# Patient Record
Sex: Female | Born: 1935 | Race: White | Hispanic: No | Smoking: Never smoker
Health system: Southern US, Community
[De-identification: ages and names within clinical notes are randomized; demographics above are authoritative.]

## PROBLEM LIST (undated history)

## (undated) DIAGNOSIS — F0390 Unspecified dementia without behavioral disturbance: Secondary | ICD-10-CM

## (undated) DIAGNOSIS — I639 Cerebral infarction, unspecified: Secondary | ICD-10-CM

## (undated) DIAGNOSIS — E7800 Pure hypercholesterolemia, unspecified: Secondary | ICD-10-CM

## (undated) DIAGNOSIS — I10 Essential (primary) hypertension: Secondary | ICD-10-CM

## (undated) DIAGNOSIS — I2510 Atherosclerotic heart disease of native coronary artery without angina pectoris: Secondary | ICD-10-CM

## (undated) HISTORY — PX: ABDOMINAL HYSTERECTOMY: SHX81

## (undated) HISTORY — PX: OTHER SURGICAL HISTORY: SHX169

## (undated) HISTORY — PX: CARDIAC SURGERY: SHX584

---

## 2004-09-29 ENCOUNTER — Ambulatory Visit: Payer: Self-pay | Admitting: Family Medicine

## 2008-06-18 ENCOUNTER — Emergency Department: Payer: Self-pay | Admitting: Emergency Medicine

## 2008-06-18 ENCOUNTER — Ambulatory Visit: Payer: Self-pay | Admitting: Family Medicine

## 2010-01-05 ENCOUNTER — Ambulatory Visit: Payer: Self-pay | Admitting: Family Medicine

## 2010-03-28 ENCOUNTER — Ambulatory Visit: Payer: Self-pay | Admitting: Family Medicine

## 2012-01-19 ENCOUNTER — Ambulatory Visit: Payer: Self-pay | Admitting: Family Medicine

## 2013-01-25 ENCOUNTER — Emergency Department: Payer: Self-pay | Admitting: Emergency Medicine

## 2013-01-25 LAB — URINALYSIS, COMPLETE
Bacteria: NONE SEEN
Bilirubin,UR: NEGATIVE
Blood: NEGATIVE
Glucose,UR: NEGATIVE mg/dL (ref 0–75)
Ketone: NEGATIVE
Leukocyte Esterase: NEGATIVE
Nitrite: NEGATIVE
Ph: 5 (ref 4.5–8.0)
Protein: NEGATIVE
RBC,UR: <1 /HPF (ref 0–5)
Specific Gravity: 1.009 (ref 1.003–1.030)
Squamous Epithelial: NONE SEEN
WBC UR: 1 /HPF (ref 0–5)

## 2015-08-25 ENCOUNTER — Other Ambulatory Visit: Payer: Self-pay | Admitting: Physical Medicine and Rehabilitation

## 2015-08-25 DIAGNOSIS — M5417 Radiculopathy, lumbosacral region: Secondary | ICD-10-CM

## 2015-09-14 ENCOUNTER — Ambulatory Visit
Admission: RE | Admit: 2015-09-14 | Discharge: 2015-09-14 | Disposition: A | Discharge status: Home / Self Care | Payer: Medicare Other | Source: Ambulatory Visit | Attending: Physical Medicine and Rehabilitation | Admitting: Physical Medicine and Rehabilitation

## 2015-09-14 DIAGNOSIS — M4806 Spinal stenosis, lumbar region: Secondary | ICD-10-CM | POA: Diagnosis not present

## 2015-09-14 DIAGNOSIS — M47816 Spondylosis without myelopathy or radiculopathy, lumbar region: Secondary | ICD-10-CM | POA: Insufficient documentation

## 2015-09-14 DIAGNOSIS — M4807 Spinal stenosis, lumbosacral region: Secondary | ICD-10-CM | POA: Insufficient documentation

## 2015-09-14 DIAGNOSIS — M5126 Other intervertebral disc displacement, lumbar region: Secondary | ICD-10-CM | POA: Insufficient documentation

## 2015-09-14 DIAGNOSIS — M5416 Radiculopathy, lumbar region: Secondary | ICD-10-CM | POA: Insufficient documentation

## 2015-09-14 DIAGNOSIS — M5417 Radiculopathy, lumbosacral region: Secondary | ICD-10-CM

## 2015-11-13 ENCOUNTER — Other Ambulatory Visit: Payer: Self-pay | Admitting: Pediatrics

## 2015-11-13 ENCOUNTER — Ambulatory Visit
Admission: RE | Admit: 2015-11-13 | Discharge: 2015-11-13 | Disposition: A | Discharge status: Home / Self Care | Payer: Medicare Other | Source: Ambulatory Visit | Attending: Pediatrics | Admitting: Pediatrics

## 2015-11-13 DIAGNOSIS — M25522 Pain in left elbow: Secondary | ICD-10-CM | POA: Diagnosis not present

## 2015-11-13 DIAGNOSIS — M11222 Other chondrocalcinosis, left elbow: Secondary | ICD-10-CM | POA: Diagnosis not present

## 2016-04-07 ENCOUNTER — Emergency Department
Admission: EM | Admit: 2016-04-07 | Discharge: 2016-04-07 | Disposition: A | Discharge status: Home / Self Care | Payer: Medicare Other | Attending: Emergency Medicine | Admitting: Emergency Medicine

## 2016-04-07 ENCOUNTER — Encounter: Payer: Self-pay | Admitting: Medical Oncology

## 2016-04-07 DIAGNOSIS — Y939 Activity, unspecified: Secondary | ICD-10-CM | POA: Insufficient documentation

## 2016-04-07 DIAGNOSIS — S61230A Puncture wound without foreign body of right index finger without damage to nail, initial encounter: Secondary | ICD-10-CM | POA: Diagnosis not present

## 2016-04-07 DIAGNOSIS — W540XXA Bitten by dog, initial encounter: Secondary | ICD-10-CM | POA: Insufficient documentation

## 2016-04-07 DIAGNOSIS — S61200A Unspecified open wound of right index finger without damage to nail, initial encounter: Secondary | ICD-10-CM | POA: Diagnosis present

## 2016-04-07 DIAGNOSIS — S61258A Open bite of other finger without damage to nail, initial encounter: Secondary | ICD-10-CM

## 2016-04-07 DIAGNOSIS — I10 Essential (primary) hypertension: Secondary | ICD-10-CM | POA: Diagnosis not present

## 2016-04-07 DIAGNOSIS — Y929 Unspecified place or not applicable: Secondary | ICD-10-CM | POA: Diagnosis not present

## 2016-04-07 DIAGNOSIS — Y999 Unspecified external cause status: Secondary | ICD-10-CM | POA: Insufficient documentation

## 2016-04-07 DIAGNOSIS — Z91040 Latex allergy status: Secondary | ICD-10-CM | POA: Insufficient documentation

## 2016-04-07 HISTORY — DX: Essential (primary) hypertension: I10

## 2016-04-07 HISTORY — DX: Pure hypercholesterolemia, unspecified: E78.00

## 2016-04-07 LAB — BASIC METABOLIC PANEL
Anion gap: 6 (ref 5–15)
BUN: 19 mg/dL (ref 6–20)
CALCIUM: 9.6 mg/dL (ref 8.9–10.3)
CO2: 28 mmol/L (ref 22–32)
CREATININE: 0.79 mg/dL (ref 0.44–1.00)
Chloride: 104 mmol/L (ref 101–111)
GFR calc non Af Amer: >60 mL/min (ref >60)
Glucose, Bld: 94 mg/dL (ref 65–99)
Potassium: 4.2 mmol/L (ref 3.5–5.1)
SODIUM: 138 mmol/L (ref 135–145)

## 2016-04-07 LAB — CBC WITH DIFFERENTIAL/PLATELET
BASOS ABS: 0.1 10*3/uL (ref 0–0.1)
BASOS PCT: 1 %
EOS ABS: 0.4 10*3/uL (ref 0–0.7)
Eosinophils Relative: 4 %
HEMATOCRIT: 40.5 % (ref 35.0–47.0)
HEMOGLOBIN: 13.9 g/dL (ref 12.0–16.0)
Lymphocytes Relative: 38 %
Lymphs Abs: 3.4 10*3/uL (ref 1.0–3.6)
MCH: 33.9 pg (ref 26.0–34.0)
MCHC: 34.3 g/dL (ref 32.0–36.0)
MCV: 98.9 fL (ref 80.0–100.0)
MONO ABS: 0.9 10*3/uL (ref 0.2–0.9)
Monocytes Relative: 10 %
NEUTROS ABS: 4.3 10*3/uL (ref 1.4–6.5)
NEUTROS PCT: 47 %
PLATELETS: 209 10*3/uL (ref 150–440)
RBC: 4.1 MIL/uL (ref 3.80–5.20)
RDW: 14.6 % — AB (ref 11.5–14.5)
WBC: 9.1 10*3/uL (ref 3.6–11.0)

## 2016-04-07 MED ORDER — SODIUM CHLORIDE 0.9 % IV SOLN
1.5000 g | Freq: Once | INTRAVENOUS | Status: AC
  Administered 2016-04-07: 1.5 g via INTRAVENOUS
  Filled 2016-04-07: qty 1.5

## 2016-04-07 MED ORDER — AMOXICILLIN-POT CLAVULANATE 875-125 MG PO TABS: 1.0000 | ORAL_TABLET | Freq: Two times a day (BID) | ORAL | 0 refills | Status: AC

## 2016-04-07 NOTE — ED Provider Notes (Signed)
[facility] Emergency Department Provider Note  ____________________________________________   First MD Initiated Contact with Patient 04/07/16 1237     (approximate)  I have reviewed the triage vital signs and the nursing notes.   HISTORY  Chief Complaint Animal Bite    HPI Yolanda Casey is a 82 y.o. female is here to be seen because her right index finger was bitten by her dog on Tuesday. Patient was seen by her PCP who sent her to the emergency room to be seen. Patient states that her dog is up-to-date on immunizations. Patient has documentation that she has had a tetanus within the last 7 years. Patient denies any fever or chills, there is been no nausea vomiting. Patient began noticing some redness yesterday and soaked her hand frequently. This morning the redness is extending but no drainage has been seen. Prior to this morning patient still had range of motion of her index finger.   Past Medical History:  Diagnosis Date  . High cholesterol   . Hypertension     There are no active problems to display for this patient.   No past surgical history on file.  Prior to Admission medications   Medication Sig Start Date End Date Taking? Authorizing Provider  amoxicillin-clavulanate (AUGMENTIN) 875-125 MG tablet Take 1 tablet by mouth 2 (two) times daily. 04/07/16 04/14/16  Tommi Rumps, PA-C    Allergies Adhesive [tape]; Codeine; and Latex  No family history on file.  Social History Social History  Substance Use Topics  . Smoking status: Not on file  . Smokeless tobacco: Not on file  . Alcohol use Not on file    Review of Systems Constitutional: No fever/chills Cardiovascular: Denies chest pain. Respiratory: Denies shortness of breath. Gastrointestinal:   No nausea, no vomiting.   Musculoskeletal: Negative for back pain. Right index finger pain. Skin: Dog bite right index finger. Neurological: Negative for headaches, focal  weakness or numbness.  10-point ROS otherwise negative.  ____________________________________________   PHYSICAL EXAM:  VITAL SIGNS: ED Triage Vitals [04/07/16 1221]  Enc Vitals Group     BP (!) 151/62     Pulse Rate 79     Resp 19     Temp 98 F (36.7 C)     Temp Source Oral     SpO2 100 %     Weight 132 lb (59.9 kg)     Height 5\' 3"  (1.6 m)     Head Circumference      Peak Flow      Pain Score      Pain Loc      Pain Edu?      Excl. in GC?     Constitutional: Alert and oriented. Well appearing and in no acute distress. Eyes: Conjunctivae are normal. PERRL. EOMI. Head: Atraumatic. Nose: No congestion/rhinnorhea. Neck: No stridor.   Cardiovascular: Normal rate, regular rhythm. Grossly normal heart sounds.  Good peripheral circulation. Respiratory: Normal respiratory effort.  No retractions. Lungs CTAB. Musculoskeletal: On examination of the right index finger dorsal aspect there is 2 individual linear, superficial puncture wounds. There is no active drainage from the area. The puncture wound is above and below the PIP joint. There is soft tissue edema present. There is some mild erythema present in the area and extending on the dorsum of the right hand. Motor sensory function intact. Capillary refill is less than 3 seconds. Patient is able to flex and extend her index finger but is slightly restricted secondary  to soft tissue swelling. Neurologic:  Normal speech and language. No gross focal neurologic deficits are appreciated. No gait instability. Skin:  Skin is warm, dry  Psychiatric: Mood and affect are normal. Speech and behavior are normal.  ____________________________________________   LABS (all labs ordered are listed, but only abnormal results are displayed)  Labs Reviewed  CBC WITH DIFFERENTIAL/PLATELET - Abnormal; Notable for the following:       Result Value   RDW 14.6 (*)    All other components within normal limits  BASIC METABOLIC PANEL      PROCEDURES  Procedure(s) performed: None  Procedures  Critical Care performed: No  ____________________________________________   INITIAL IMPRESSION / ASSESSMENT AND PLAN / ED COURSE  Pertinent labs & imaging results that were available during my care of the patient were reviewed by me and considered in my medical decision making (see chart for details).    Clinical Course   While in the emergency room patient was given IV Unasyn 1.5 mg. Patient tolerated antibiotic well and was given a prescription for Augmentin 875 one twice a day. She is instructed to continue cleaning her wound with mild 7 water. Patient was also given a prescription for tramadol which she has taken in the past without any side effects as needed for pain. Patient is to follow-up with her primary care if any continued problems. She is instructed to return to the emergency room over the weekend if any severe sudden worsening of her hand or urgent concerns.  ____________________________________________   FINAL CLINICAL IMPRESSION(S) / ED DIAGNOSES  Final diagnoses:  Dog bite of index finger, initial encounter      NEW MEDICATIONS STARTED DURING THIS VISIT:  Discharge Medication List as of 04/07/2016  2:30 PM    START taking these medications   Details  amoxicillin-clavulanate (AUGMENTIN) 875-125 MG tablet Take 1 tablet by mouth 2 (two) times daily., Starting Thu 04/07/2016, Until Thu 04/14/2016, Print         Note:  This document was prepared using Dragon voice recognition software and may include unintentional dictation errors.    Tommi Rumpshonda L Derhonda Eastlick, PA-C 04/07/16 1614    Jennye MoccasinBrian S Quigley, MD 04/08/16 [phone]

## 2016-04-07 NOTE — Discharge Instructions (Signed)
Take Augmentin twice a day as directed. Continue to clean with mild soap and water daily. Wear splint for protection. Follow-up with your doctor if any continued problems. Return to the emergency room over the weekend if any worsening of your hand or urgent concerns.

## 2016-04-07 NOTE — ED Triage Notes (Signed)
Pt reports she was bit by her own dog Tuesday. Pt reports dogs shots are UTD. Pt also reports her last tetanus was about 7 years ago. Pt has swelling to rt hand index finger.

## 2016-12-27 ENCOUNTER — Encounter: Payer: Self-pay | Admitting: Emergency Medicine

## 2016-12-27 ENCOUNTER — Observation Stay
Admission: EM | Admit: 2016-12-27 | Discharge: 2016-12-29 | Disposition: A | Discharge status: Home / Self Care | Payer: Medicare Other | Attending: Internal Medicine | Admitting: Internal Medicine

## 2016-12-27 DIAGNOSIS — Z79899 Other long term (current) drug therapy: Secondary | ICD-10-CM | POA: Diagnosis not present

## 2016-12-27 DIAGNOSIS — Z8249 Family history of ischemic heart disease and other diseases of the circulatory system: Secondary | ICD-10-CM | POA: Insufficient documentation

## 2016-12-27 DIAGNOSIS — I10 Essential (primary) hypertension: Secondary | ICD-10-CM | POA: Diagnosis not present

## 2016-12-27 DIAGNOSIS — R8281 Pyuria: Secondary | ICD-10-CM

## 2016-12-27 DIAGNOSIS — G8929 Other chronic pain: Secondary | ICD-10-CM | POA: Diagnosis not present

## 2016-12-27 DIAGNOSIS — M5136 Other intervertebral disc degeneration, lumbar region: Secondary | ICD-10-CM | POA: Diagnosis not present

## 2016-12-27 DIAGNOSIS — Z91040 Latex allergy status: Secondary | ICD-10-CM | POA: Insufficient documentation

## 2016-12-27 DIAGNOSIS — Z888 Allergy status to other drugs, medicaments and biological substances status: Secondary | ICD-10-CM | POA: Insufficient documentation

## 2016-12-27 DIAGNOSIS — M4800 Spinal stenosis, site unspecified: Secondary | ICD-10-CM

## 2016-12-27 DIAGNOSIS — Z841 Family history of disorders of kidney and ureter: Secondary | ICD-10-CM | POA: Insufficient documentation

## 2016-12-27 DIAGNOSIS — M48061 Spinal stenosis, lumbar region without neurogenic claudication: Secondary | ICD-10-CM | POA: Insufficient documentation

## 2016-12-27 DIAGNOSIS — R52 Pain, unspecified: Secondary | ICD-10-CM

## 2016-12-27 DIAGNOSIS — M5441 Lumbago with sciatica, right side: Secondary | ICD-10-CM | POA: Diagnosis present

## 2016-12-27 DIAGNOSIS — R531 Weakness: Secondary | ICD-10-CM

## 2016-12-27 DIAGNOSIS — Z7982 Long term (current) use of aspirin: Secondary | ICD-10-CM | POA: Insufficient documentation

## 2016-12-27 DIAGNOSIS — R32 Unspecified urinary incontinence: Secondary | ICD-10-CM | POA: Insufficient documentation

## 2016-12-27 DIAGNOSIS — N390 Urinary tract infection, site not specified: Secondary | ICD-10-CM | POA: Diagnosis present

## 2016-12-27 DIAGNOSIS — Z885 Allergy status to narcotic agent status: Secondary | ICD-10-CM | POA: Insufficient documentation

## 2016-12-27 DIAGNOSIS — R7303 Prediabetes: Secondary | ICD-10-CM | POA: Diagnosis not present

## 2016-12-27 DIAGNOSIS — M5126 Other intervertebral disc displacement, lumbar region: Secondary | ICD-10-CM | POA: Insufficient documentation

## 2016-12-27 DIAGNOSIS — E7800 Pure hypercholesterolemia, unspecified: Secondary | ICD-10-CM | POA: Diagnosis not present

## 2016-12-27 DIAGNOSIS — M545 Low back pain: Secondary | ICD-10-CM | POA: Diagnosis present

## 2016-12-27 DIAGNOSIS — R739 Hyperglycemia, unspecified: Secondary | ICD-10-CM

## 2016-12-27 DIAGNOSIS — M4316 Spondylolisthesis, lumbar region: Secondary | ICD-10-CM | POA: Diagnosis not present

## 2016-12-27 DIAGNOSIS — Z91048 Other nonmedicinal substance allergy status: Secondary | ICD-10-CM | POA: Diagnosis not present

## 2016-12-27 DIAGNOSIS — M5459 Other low back pain: Secondary | ICD-10-CM

## 2016-12-27 DIAGNOSIS — M549 Dorsalgia, unspecified: Secondary | ICD-10-CM | POA: Diagnosis present

## 2016-12-27 MED ORDER — LIDOCAINE 5 % EX PTCH
1.0000 | MEDICATED_PATCH | CUTANEOUS | Status: DC
  Administered 2016-12-27 – 2016-12-28 (×2): 1 via TRANSDERMAL
  Filled 2016-12-27 (×3): qty 1

## 2016-12-27 MED ORDER — ONDANSETRON HCL 4 MG/2ML IJ SOLN
4.0000 mg | Freq: Once | INTRAMUSCULAR | Status: AC
  Administered 2016-12-27: 4 mg via INTRAVENOUS
  Filled 2016-12-27: qty 2

## 2016-12-27 MED ORDER — TRAMADOL HCL 50 MG PO TABS
25.0000 mg | ORAL_TABLET | Freq: Once | ORAL | Status: AC
  Administered 2016-12-27: 25 mg via ORAL
  Filled 2016-12-27: qty 1

## 2016-12-27 NOTE — ED Provider Notes (Signed)
[facility] Emergency Department Provider Note   ____________________________________________   First MD Initiated Contact with Patient 12/27/16 2305     (approximate)  I have reviewed the triage vital signs and the nursing notes.   HISTORY  Chief Complaint Back Pain    HPI Yolanda Casey is a 83 y.o. female who presents to the ED from home via EMS with a chief complaint of acute on chronic lower back pain. Patient has a history of chronic back pain which she prefers to manage with Tylenol. States her pain is exacerbated by changes in weather and exertion. States she has been gardening this week and felt the pain coming on with the weather. Reports today was the first day that Tylenol has been unable to control pain. States pain is always worse on the right side with sciatica symptoms but denies numbness/tingling/extremity weakness. Reports chronic incontinence issues and states there are no new or concerning symptoms.Reports urinary frequency chronically. She was unable to move about this evening hence family called 911. Denies recent fever, chills, chest pain, shortness of breath, abdominal pain, nausea, vomiting, diarrhea. Denies recent travel or trauma. Nothing makes her pain better. Movement makes her pain worse.   Past Medical History:  Diagnosis Date  . High cholesterol   . Hypertension     There are no active problems to display for this patient.   History reviewed. No pertinent surgical history.  Prior to Admission medications   Not on File    Allergies Adhesive [tape]; Codeine; and Latex  No family history on file.  Social History Social History  Substance Use Topics  . Smoking status: Never Smoker  . Smokeless tobacco: Never Used  . Alcohol use No    Review of Systems  Constitutional: No fever/chills. Eyes: No visual changes. ENT: No sore throat. Cardiovascular: Denies chest pain. Respiratory: Denies shortness of  breath. Gastrointestinal: No abdominal pain.  No nausea, no vomiting.  No diarrhea.  No constipation. Genitourinary: Negative for dysuria. Musculoskeletal: Positive for back pain. Skin: Negative for rash. Neurological: Negative for headaches, focal weakness or numbness.   ____________________________________________   PHYSICAL EXAM:  VITAL SIGNS: ED Triage Vitals [12/27/16 2231]  Enc Vitals Group     BP (!) 177/84     Pulse Rate 85     Resp 16     Temp 98.3 F (36.8 C)     Temp Source Oral     SpO2 96 %     Weight 130 lb (59 kg)     Height 5\' 3"  (1.6 m)     Head Circumference      Peak Flow      Pain Score      Pain Loc      Pain Edu?      Excl. in GC?     Constitutional: Alert and oriented. Well appearing and in mild acute distress. Eyes: Conjunctivae are normal. PERRL. EOMI. Head: Atraumatic. Nose: No congestion/rhinnorhea. Mouth/Throat: Mucous membranes are moist.  Oropharynx non-erythematous. Neck: No stridor.  No cervical spine tenderness to palpation. Cardiovascular: Normal rate, regular rhythm. Grossly normal heart sounds.  Good peripheral circulation. Respiratory: Normal respiratory effort.  No retractions. Lungs CTAB. Gastrointestinal: Soft and nontender. No distention. No abdominal bruits. No CVA tenderness. Musculoskeletal: No spinal tenderness to palpation. Lumbar back tight across the midline. Negative straight leg raise. No tenderness to right buttock. No lower extremity tenderness nor edema.  No joint effusions. Neurologic:  Normal speech and language. No gross  focal neurologic deficits are appreciated.  Skin:  Skin is warm, dry and intact. No rash noted. Psychiatric: Mood and affect are normal. Speech and behavior are normal.  ____________________________________________   LABS (all labs ordered are listed, but only abnormal results are displayed)  Labs Reviewed  URINALYSIS, COMPLETE (UACMP) WITH MICROSCOPIC - Abnormal; Notable for the following:        Result Value   Color, Urine STRAW (*)    APPearance CLEAR (*)    Specific Gravity, Urine 1.004 (*)    Hgb urine dipstick MODERATE (*)    Leukocytes, UA LARGE (*)    Bacteria, UA RARE (*)    Squamous Epithelial / LPF 0-5 (*)    Non Squamous Epithelial 0-5 (*)    All other components within normal limits  BASIC METABOLIC PANEL - Abnormal; Notable for the following:    Glucose, Bld 115 (*)    GFR calc non Af Amer 52 (*)    All other components within normal limits  URINE CULTURE  CBC WITH DIFFERENTIAL/PLATELET   ____________________________________________  EKG  None ____________________________________________  RADIOLOGY  No results found.  ____________________________________________   PROCEDURES  Procedure(s) performed: None  Procedures  Critical Care performed: No  ____________________________________________   INITIAL IMPRESSION / ASSESSMENT AND PLAN / ED COURSE  Pertinent labs & imaging results that were available during my care of the patient were reviewed by me and considered in my medical decision making (see chart for details).  83 year old female who presents with acute on chronic lower back pain. No focal neurological deficits on examination. Will try low-dose analgesia and reassess  Clinical Course as of Dec 28 212  Wed Dec 28, 2016  0109 Patient reports no change in pain level. Will try fentanyl. Rocephin for UTI. If pain is intractable, will discuss with hospitalist to evaluate patient in the emergency department for admission.  [JS]  0202 Pain down to 3/10. Will attempt to ambulate patient.  [JS]  0212 Pain increased in intensity upon the slightest movement. Patient now nauseated secondary to pain. Unable to get out of bed much less ambulate. Will discuss with hospitalist evaluate patient in the emergency department for admission.  [JS]    Clinical Course User Index [JS] Irean HongSung, Fusae Florio J, MD      ____________________________________________   FINAL CLINICAL IMPRESSION(S) / ED DIAGNOSES  Final diagnoses:  Chronic bilateral low back pain with right-sided sciatica  Acute bilateral low back pain with right-sided sciatica  Lower urinary tract infectious disease  Intractable low back pain      NEW MEDICATIONS STARTED DURING THIS VISIT:  New Prescriptions   No medications on file     Note:  This document was prepared using Dragon voice recognition software and may include unintentional dictation errors.    Irean HongSung, Vaishali Baise J, MD 12/28/16 [phone]

## 2016-12-27 NOTE — ED Notes (Signed)
Family at bedside. 

## 2016-12-27 NOTE — ED Triage Notes (Signed)
Pt arrived via ems from home where pt lives with her daughter. Pt states she has had severe lower back pain that has increased in intensity over the last two days. Pt reports the pain has causes her to be less mobile and decreased her ability to perform her normally independent ADLS. Pt is alert and oriented upon arrival. Pt states when she moves her pain is a 10 and when she is completely still pain is a 7.

## 2016-12-28 ENCOUNTER — Encounter: Payer: Self-pay | Admitting: Internal Medicine

## 2016-12-28 DIAGNOSIS — M5136 Other intervertebral disc degeneration, lumbar region: Secondary | ICD-10-CM | POA: Diagnosis not present

## 2016-12-28 DIAGNOSIS — M549 Dorsalgia, unspecified: Secondary | ICD-10-CM | POA: Diagnosis present

## 2016-12-28 LAB — URINALYSIS, COMPLETE (UACMP) WITH MICROSCOPIC
Bilirubin Urine: NEGATIVE
GLUCOSE, UA: NEGATIVE mg/dL
Ketones, ur: NEGATIVE mg/dL
NITRITE: NEGATIVE
PH: 7 (ref 5.0–8.0)
PROTEIN: NEGATIVE mg/dL
Specific Gravity, Urine: 1.004 — ABNORMAL LOW (ref 1.005–1.030)

## 2016-12-28 LAB — BASIC METABOLIC PANEL
Anion gap: 7 (ref 5–15)
BUN: 20 mg/dL (ref 6–20)
CALCIUM: 9.5 mg/dL (ref 8.9–10.3)
CO2: 26 mmol/L (ref 22–32)
CREATININE: 0.95 mg/dL (ref 0.44–1.00)
Chloride: 104 mmol/L (ref 101–111)
GFR calc Af Amer: >60 mL/min (ref >60)
GFR, EST NON AFRICAN AMERICAN: 52 mL/min — AB (ref >60)
GLUCOSE: 115 mg/dL — AB (ref 65–99)
Potassium: 3.9 mmol/L (ref 3.5–5.1)
Sodium: 137 mmol/L (ref 135–145)

## 2016-12-28 LAB — CBC WITH DIFFERENTIAL/PLATELET
Basophils Absolute: 0 10*3/uL (ref 0–0.1)
Basophils Relative: 1 %
EOS PCT: 1 %
Eosinophils Absolute: 0.1 10*3/uL (ref 0–0.7)
HEMATOCRIT: 41.6 % (ref 35.0–47.0)
Hemoglobin: 14.2 g/dL (ref 12.0–16.0)
LYMPHS ABS: 3 10*3/uL (ref 1.0–3.6)
LYMPHS PCT: 34 %
MCH: 33.5 pg (ref 26.0–34.0)
MCHC: 34.2 g/dL (ref 32.0–36.0)
MCV: 98 fL (ref 80.0–100.0)
MONO ABS: 0.8 10*3/uL (ref 0.2–0.9)
MONOS PCT: 9 %
NEUTROS ABS: 4.8 10*3/uL (ref 1.4–6.5)
Neutrophils Relative %: 55 %
PLATELETS: 200 10*3/uL (ref 150–440)
RBC: 4.24 MIL/uL (ref 3.80–5.20)
RDW: 13.8 % (ref 11.5–14.5)
WBC: 8.8 10*3/uL (ref 3.6–11.0)

## 2016-12-28 MED ORDER — PSYLLIUM 95 % PO PACK
1.0000 | PACK | Freq: Every day | ORAL | Status: DC
  Administered 2016-12-28 – 2016-12-29 (×2): 1 via ORAL
  Filled 2016-12-28 (×3): qty 1

## 2016-12-28 MED ORDER — ROSUVASTATIN CALCIUM 20 MG PO TABS
40.0000 mg | ORAL_TABLET | Freq: Every day | ORAL | Status: DC
  Administered 2016-12-28 – 2016-12-29 (×2): 40 mg via ORAL
  Filled 2016-12-28 (×2): qty 4
  Filled 2016-12-28: qty 2
  Filled 2016-12-28: qty 4

## 2016-12-28 MED ORDER — ONDANSETRON HCL 4 MG/2ML IJ SOLN
4.0000 mg | Freq: Once | INTRAMUSCULAR | Status: AC
  Administered 2016-12-28: 4 mg via INTRAVENOUS
  Filled 2016-12-28: qty 2

## 2016-12-28 MED ORDER — DEXTROSE 5 % IV SOLN
1.0000 g | INTRAVENOUS | Status: DC
  Administered 2016-12-28: 1 g via INTRAVENOUS
  Filled 2016-12-28 (×2): qty 10

## 2016-12-28 MED ORDER — ONDANSETRON HCL 4 MG/2ML IJ SOLN
4.0000 mg | Freq: Four times a day (QID) | INTRAMUSCULAR | Status: DC | PRN
Start: 2016-12-28 — End: 2016-12-29

## 2016-12-28 MED ORDER — ASPIRIN EC 81 MG PO TBEC
81.0000 mg | DELAYED_RELEASE_TABLET | Freq: Every day | ORAL | Status: DC
  Administered 2016-12-28 – 2016-12-29 (×2): 81 mg via ORAL
  Filled 2016-12-28 (×2): qty 1

## 2016-12-28 MED ORDER — ACETAMINOPHEN 325 MG PO TABS: 650.0000 mg | ORAL_TABLET | Freq: Four times a day (QID) | ORAL | Status: DC | PRN

## 2016-12-28 MED ORDER — SODIUM CHLORIDE 0.9 % IV SOLN: 250.0000 mL | INTRAVENOUS | Status: DC | PRN

## 2016-12-28 MED ORDER — PSYLLIUM 0.52 G PO CAPS: 0.5200 g | ORAL_CAPSULE | Freq: Every day | ORAL | Status: DC

## 2016-12-28 MED ORDER — SENNOSIDES-DOCUSATE SODIUM 8.6-50 MG PO TABS: 1.0000 | ORAL_TABLET | Freq: Every evening | ORAL | Status: DC | PRN

## 2016-12-28 MED ORDER — ACETAMINOPHEN 650 MG RE SUPP: 650.0000 mg | Freq: Four times a day (QID) | RECTAL | Status: DC | PRN

## 2016-12-28 MED ORDER — CEFTRIAXONE SODIUM 2 G IJ SOLR: 2.0000 g | INTRAMUSCULAR | Status: DC

## 2016-12-28 MED ORDER — SODIUM CHLORIDE 0.9% FLUSH: 3.0000 mL | INTRAVENOUS | Status: DC | PRN

## 2016-12-28 MED ORDER — CENTRUM SILVER PO CHEW: 1.0000 | CHEWABLE_TABLET | Freq: Two times a day (BID) | ORAL | Status: DC

## 2016-12-28 MED ORDER — TRAMADOL HCL 50 MG PO TABS: 50.0000 mg | ORAL_TABLET | Freq: Four times a day (QID) | ORAL | Status: DC | PRN

## 2016-12-28 MED ORDER — ADULT MULTIVITAMIN W/MINERALS CH
1.0000 | ORAL_TABLET | Freq: Two times a day (BID) | ORAL | Status: DC
  Administered 2016-12-28 – 2016-12-29 (×3): 1 via ORAL
  Filled 2016-12-28 (×3): qty 1

## 2016-12-28 MED ORDER — ONDANSETRON HCL 4 MG PO TABS: 4.0000 mg | ORAL_TABLET | Freq: Four times a day (QID) | ORAL | Status: DC | PRN

## 2016-12-28 MED ORDER — ENOXAPARIN SODIUM 40 MG/0.4ML ~~LOC~~ SOLN
40.0000 mg | SUBCUTANEOUS | Status: DC
  Administered 2016-12-28: 40 mg via SUBCUTANEOUS
  Filled 2016-12-28: qty 0.4

## 2016-12-28 MED ORDER — SODIUM CHLORIDE 0.9% FLUSH
3.0000 mL | Freq: Two times a day (BID) | INTRAVENOUS | Status: DC
  Administered 2016-12-28 – 2016-12-29 (×3): 3 mL via INTRAVENOUS

## 2016-12-28 MED ORDER — FENTANYL CITRATE (PF) 100 MCG/2ML IJ SOLN
50.0000 ug | Freq: Once | INTRAMUSCULAR | Status: AC
  Administered 2016-12-28: 50 ug via INTRAVENOUS
  Filled 2016-12-28: qty 2

## 2016-12-28 MED ORDER — MORPHINE SULFATE (PF) 2 MG/ML IV SOLN: 2.0000 mg | INTRAVENOUS | Status: DC | PRN

## 2016-12-28 MED ORDER — RAMIPRIL 5 MG PO CAPS
5.0000 mg | ORAL_CAPSULE | Freq: Every day | ORAL | Status: DC
  Administered 2016-12-28 – 2016-12-29 (×2): 5 mg via ORAL
  Filled 2016-12-28 (×3): qty 1

## 2016-12-28 MED ORDER — METOPROLOL SUCCINATE ER 25 MG PO TB24
12.5000 mg | ORAL_TABLET | Freq: Every day | ORAL | Status: DC
  Administered 2016-12-28 – 2016-12-29 (×2): 12.5 mg via ORAL
  Filled 2016-12-28: qty 1
  Filled 2016-12-28: qty 0.5

## 2016-12-28 MED ORDER — DEXTROSE 5 % IV SOLN
1.0000 g | INTRAVENOUS | Status: DC
  Administered 2016-12-28: 1 g via INTRAVENOUS
  Filled 2016-12-28: qty 10

## 2016-12-28 NOTE — Progress Notes (Signed)
Pharmacy Antibiotic Note  Yolanda FavorsDoris L Cauthorn is a 83 y.o. female admitted on 12/27/2016 with UTI.  Pharmacy has been consulted for ceftriaxone dosing.  Plan: Ceftriaxone 2 grams q 24 hours ordered.  Height: 5\' 3"  (160 cm) Weight: 130 lb (59 kg) IBW/kg (Calculated) : 52.4  Temp (24hrs), Avg:98.3 F (36.8 C), Min:98.3 F (36.8 C), Max:98.3 F (36.8 C)   Recent Labs Lab 12/28/16 0108  WBC 8.8  CREATININE 0.95    Estimated Creatinine Clearance: 33.9 mL/min (by C-G formula based on SCr of 0.95 mg/dL).    Allergies  Allergen Reactions  . Omeprazole Other (See Comments)    Other Reaction: gi upset  . Atenolol Other (See Comments)    Other Reaction: DROP IN BP  . Levofloxacin Diarrhea  . Adhesive [Tape]   . Albuterol Other (See Comments)  . Atorvastatin Other (See Comments)  . Ciprofloxacin Other (See Comments)  . Codeine   . Diclofenac Other (See Comments)  . Fluoxetine Other (See Comments)  . Indomethacin Other (See Comments)  . Latex   . Losartan Other (See Comments)  . Oxybutynin Chloride Other (See Comments)  . Oxycodone-Acetaminophen Other (See Comments)  . Sucralfate Other (See Comments)  . Tramadol Diarrhea    Pt and daughter report it gives her diarrhea. Usually probiotics are given to her to help per daughter.   . Gabapentin Nausea Only    Antimicrobials this admission: ceftriaxone  >>    >>   Dose adjustments this admission:   Microbiology results: 6/27 UCx: pending       6/27 UA: LE(+) NO2(-) WBC TNTC Thank you for allowing pharmacy to be a part of this patient's care.  Jaxton Casale S 12/28/2016 4:35 AM

## 2016-12-28 NOTE — ED Notes (Signed)
Pt assisted to ambulation in room. Pt unsteady on her feet. Pt assisted back into bed.

## 2016-12-28 NOTE — Progress Notes (Signed)
[facility] [place] at [facility]   PATIENT NAME: Yolanda Casey    MR#:  [id number]  DATE OF BIRTH:  09-19-1925  SUBJECTIVE:  CHIEF COMPLAINT:   Chief Complaint  Patient presents with  . Back Pain  Patient is a 83 year old Caucasian female with past medical history significant for history of degenerative disease,moderate to severe central canal stenosis, disc prolapse, L1, L2, as per MRI of lumbar spine in March 2017, who presents to the hospital with worsening back pain. She denies any fall or injury. On arrival to the hospital, her labs were unremarkable, no radiologic studies were unfortunately performed, patient was initiated on opiates. Her labs revealed pyuria, concerning for urinary tract infection, however, no leukocytosis or fevers. She was initiated on high dose of Rocephin. She denies any dysuria symptoms. Patient's pain is not as pronounced, she has dementia and has difficulty answering my questions  Review of Systems  Constitutional: Negative for chills, fever and weight loss.  HENT: Negative for congestion.   Eyes: Negative for blurred vision and double vision.  Respiratory: Negative for cough, sputum production, shortness of breath and wheezing.   Cardiovascular: Negative for chest pain, palpitations, orthopnea, leg swelling and PND.  Gastrointestinal: Negative for abdominal pain, blood in stool, constipation, diarrhea, nausea and vomiting.  Genitourinary: Negative for dysuria, frequency, hematuria and urgency.  Musculoskeletal: Positive for back pain. Negative for falls.  Neurological: Negative for dizziness, tremors, focal weakness and headaches.  Endo/Heme/Allergies: Does not bruise/bleed easily.  Psychiatric/Behavioral: Negative for depression. The patient does not have insomnia.     VITAL SIGNS: Blood pressure (!) 151/59, pulse 71, temperature 97.9 F (36.6 C), temperature source Oral, resp. rate 18, height 5\' 3"  (1.6 m), weight 63.2 kg  (139 lb 6.4 oz), SpO2 97 %.  PHYSICAL EXAMINATION:   GENERAL:  83 y.o.-year-old patient lying in the bed with no acute distress.  EYES: Pupils equal, round, reactive to light and accommodation. No scleral icterus. Extraocular muscles intact.  HEENT: Head atraumatic, normocephalic. Oropharynx and nasopharynx clear.  NECK:  Supple, no jugular venous distention. No thyroid enlargement, no tenderness.  LUNGS: Normal breath sounds bilaterally, no wheezing, rales,rhonchi or crepitation. No use of accessory muscles of respiration.  CARDIOVASCULAR: S1, S2 normal. No murmurs, rubs, or gallops.  ABDOMEN: Soft, nontender, nondistended. Bowel sounds present. No organomegaly or mass.  EXTREMITIES: No pedal edema, cyanosis, or clubbing. Some discomfort on lumbar palpation, lumbosacral palpation on the right. No CVA tenderness on percussion NEUROLOGIC: Cranial nerves II through XII are intact. Muscle strength 5/5 in all extremities. Sensation intact. Gait not checked.  PSYCHIATRIC: The patient is alert and oriented x 1.  SKIN: No obvious rash, lesion, or ulcer.   ORDERS/RESULTS REVIEWED:   CBC  Recent Labs Lab 12/28/16 0108  WBC 8.8  HGB 14.2  HCT 41.6  PLT 200  MCV 98.0  MCH 33.5  MCHC 34.2  RDW 13.8  LYMPHSABS 3.0  MONOABS 0.8  EOSABS 0.1  BASOSABS 0.0   ------------------------------------------------------------------------------------------------------------------  Chemistries   Recent Labs Lab 12/28/16 0108  NA 137  K 3.9  CL 104  CO2 26  GLUCOSE 115*  BUN 20  CREATININE 0.95  CALCIUM 9.5   ------------------------------------------------------------------------------------------------------------------ estimated creatinine clearance is 36.6 mL/min (by C-G formula based on SCr of 0.95 mg/dL). ------------------------------------------------------------------------------------------------------------------ No results for input(s): TSH, T4TOTAL, T3FREE, THYROIDAB in the  last 72 hours.  Invalid input(s): FREET3  Cardiac Enzymes No results for input(s): CKMB, TROPONINI, MYOGLOBIN in the last 168  hours.  Invalid input(s): CK ------------------------------------------------------------------------------------------------------------------ Invalid input(s): POCBNP ---------------------------------------------------------------------------------------------------------------  RADIOLOGY: No results found.  EKG: No orders found for this or any previous visit.  ASSESSMENT AND PLAN:  Active Problems:   Back pain  #1. Lower back pain with known history of degenerative disc disease, neural foraminal stenosis, spondylosis, central canal stenosis, disc protrusion at L1, L2, as of March 2017, continue opiates, physical therapy, repeat MRI of lumbar spine, get neurosurgery consultation if significant changes. Physical therapist recommended outpatient physical therapy, get lumbar corset #2. Hyperglycemia, get hemoglobin A1c #3. Pyuria, concerning for Urinary tract infection, although patient's white cell count is normal, no fevers, no significant symptoms, continue antibiotic therapy for now. Awaiting for culture results #4. Essential hypertension, continue Altace, advance medications as needed, blood pressure could be pain driven   Management plans discussed with the patient, family and they are in agreement.   DRUG ALLERGIES:  Allergies  Allergen Reactions  . Omeprazole Other (See Comments)    Other Reaction: gi upset  . Atenolol Other (See Comments)    Other Reaction: DROP IN BP  . Levofloxacin Diarrhea  . Adhesive [Tape]   . Albuterol Other (See Comments)  . Atorvastatin Other (See Comments)  . Ciprofloxacin Other (See Comments)  . Codeine   . Diclofenac Other (See Comments)  . Fluoxetine Other (See Comments)  . Indomethacin Other (See Comments)  . Latex   . Losartan Other (See Comments)  . Oxybutynin Chloride Other (See Comments)  .  Oxycodone-Acetaminophen Other (See Comments)  . Sucralfate Other (See Comments)  . Tramadol Diarrhea    Pt and daughter report it gives her diarrhea. Usually probiotics are given to her to help per daughter.   . Gabapentin Nausea Only    CODE STATUS:     Code Status Orders        Start     Ordered   12/28/16 0537  Full code  Continuous     12/28/16 0536    Code Status History    Date Active Date Inactive Code Status Order ID Comments User Context   This patient has a current code status but no historical code status.    Advance Directive Documentation     Most Recent Value  Type of Advance Directive  Living will  Pre-existing out of facility DNR order (yellow form or pink MOST form)  -  "MOST" Form in Place?  -      TOTAL TIME TAKING CARE OF THIS PATIENT: 40 minutes.    [name],Keishon Chavarin M.D on 12/28/2016 at 5:22 PM  Between 7am to 6pm - Pager - [phone]  After 6pm go to [email or web address] - password [email or web address] [email or web address]  [facility]  Office  [phone]  CC: Primary care physician; Rolm GalaGrandis, Heidi, MD

## 2016-12-28 NOTE — H&P (Signed)
[facility] [place] at [facility]   PATIENT NAME: Yolanda Casey    MR#:  [id number]  DATE OF BIRTH:  1926-04-11  DATE OF ADMISSION:  12/27/2016  PRIMARY CARE PHYSICIAN: Rolm Gala, MD   REQUESTING/REFERRING PHYSICIAN:   CHIEF COMPLAINT:   Chief Complaint  Patient presents with  . Back Pain    HISTORY OF PRESENT ILLNESS: Yolanda Casey  is a 83 y.o. female with a known history of Hyperlipidemia, hypertension presented to the emergency room with severe back pain. Patient was bending a lot and doing gardening for the last 2-3 days. She has been aggressively gardening for the last 3 days. She has more back pain and usually she takes Tylenol but it did not relieve pain. In the emergency room she was given Tylenol and tramadol and fentanyl but still she continued to have pain. She was not able to get up and ambulate. She was worked up and her urine showed infection and she received IV Rocephin antibiotic. Hospitalist service was consulted for further pain management. No numbness in any part of the body. No bowel or bladder incontinence. She was able to move all the extremities and strength was normal. He shouldn't has history of chronic back pain and goes to pain clinic.  PAST MEDICAL HISTORY:   Past Medical History:  Diagnosis Date  . High cholesterol   . Hypertension     PAST SURGICAL HISTORY: Past Surgical History:  Procedure Laterality Date  . none      SOCIAL HISTORY:  Social History  Substance Use Topics  . Smoking status: Never Smoker  . Smokeless tobacco: Never Used  . Alcohol use No    FAMILY HISTORY:  Family History  Problem Relation Age of Onset  . Renal Disease Mother   . Heart failure Father     DRUG ALLERGIES:  Allergies  Allergen Reactions  . Omeprazole Other (See Comments)    Other Reaction: gi upset  . Atenolol Other (See Comments)    Other Reaction: DROP IN BP  . Levofloxacin Diarrhea  . Adhesive [Tape]   . Albuterol  Other (See Comments)  . Atorvastatin Other (See Comments)  . Ciprofloxacin Other (See Comments)  . Codeine   . Diclofenac Other (See Comments)  . Fluoxetine Other (See Comments)  . Indomethacin Other (See Comments)  . Latex   . Losartan Other (See Comments)  . Oxybutynin Chloride Other (See Comments)  . Oxycodone-Acetaminophen Other (See Comments)  . Sucralfate Other (See Comments)  . Tramadol Diarrhea    Pt and daughter report it gives her diarrhea. Usually probiotics are given to her to help per daughter.   . Gabapentin Nausea Only    REVIEW OF SYSTEMS:   CONSTITUTIONAL: No fever, fatigue or weakness.  EYES: No blurred or double vision.  EARS, NOSE, AND THROAT: No tinnitus or ear pain.  RESPIRATORY: No cough, shortness of breath, wheezing or hemoptysis.  CARDIOVASCULAR: No chest pain, orthopnea, edema.  GASTROINTESTINAL: No nausea, vomiting, diarrhea or abdominal pain.  GENITOURINARY: Has dysuria, no hematuria.  ENDOCRINE: No polyuria, nocturia,  HEMATOLOGY: No anemia, easy bruising or bleeding SKIN: No rash or lesion. MUSCULOSKELETAL: No joint pain or arthritis.  Has back pain  NEUROLOGIC: No tingling, numbness, weakness.  PSYCHIATRY: No anxiety or depression.   MEDICATIONS AT HOME:  Prior to Admission medications   Medication Sig Start Date End Date Taking? Authorizing Provider  acetaminophen (TYLENOL) 500 MG tablet Take 1-2 tablets by mouth every 6 (six) hours as  needed. 11/27/08  Yes [provider]  aspirin EC 81 MG tablet Take 1 tablet by mouth daily. 11/27/08  Yes [provider]  Multiple Vitamins-Minerals (CENTRUM SILVER) CHEW Chew 1 tablet by mouth 2 (two) times daily.   Yes [provider]  psyllium (REGULOID) 0.52 g capsule Take 0.52 g by mouth daily.   Yes [provider]  ramipril (ALTACE) 5 MG capsule Take 1 capsule by mouth daily. 12/16/16  Yes [provider]  rosuvastatin (CRESTOR) 40 MG tablet Take 1 tablet by  mouth daily. 10/23/16  Yes [provider]  TOPROL XL 25 MG 24 hr tablet Take 0.5 tablets by mouth daily. 11/10/16  Yes [provider]      PHYSICAL EXAMINATION:   VITAL SIGNS: Blood pressure 122/60, pulse 61, temperature 98.3 F (36.8 C), temperature source Oral, resp. rate 18, height 5\' 3"  (1.6 m), weight 59 kg (130 lb), SpO2 96 %.  GENERAL:  83 y.o.-year-old patient lying in the bed with no acute distress.  EYES: Pupils equal, round, reactive to light and accommodation. No scleral icterus. Extraocular muscles intact.  HEENT: Head atraumatic, normocephalic. Oropharynx and nasopharynx clear.  NECK:  Supple, no jugular venous distention. No thyroid enlargement, no tenderness.  LUNGS: Normal breath sounds bilaterally, no wheezing, rales,rhonchi or crepitation. No use of accessory muscles of respiration.  CARDIOVASCULAR: S1, S2 normal. No murmurs, rubs, or gallops.  ABDOMEN: Soft, nontender, nondistended. Bowel sounds present. No organomegaly or mass.  Has colostomy EXTREMITIES: No pedal edema, cyanosis, or clubbing.  Has lower lumbar back pain NEUROLOGIC: Cranial nerves II through XII are intact. Muscle strength 5/5 in all extremities. Sensation intact. Gait not checked.  PSYCHIATRIC: The patient is alert and oriented x 3.  SKIN: No obvious rash, lesion, or ulcer.   LABORATORY PANEL:   CBC  Recent Labs Lab 12/28/16 0108  WBC 8.8  HGB 14.2  HCT 41.6  PLT 200  MCV 98.0  MCH 33.5  MCHC 34.2  RDW 13.8  LYMPHSABS 3.0  MONOABS 0.8  EOSABS 0.1  BASOSABS 0.0   ------------------------------------------------------------------------------------------------------------------  Chemistries   Recent Labs Lab 12/28/16 0108  NA 137  K 3.9  CL 104  CO2 26  GLUCOSE 115*  BUN 20  CREATININE 0.95  CALCIUM 9.5   ------------------------------------------------------------------------------------------------------------------ estimated creatinine clearance is  33.9 mL/min (by C-G formula based on SCr of 0.95 mg/dL). ------------------------------------------------------------------------------------------------------------------ No results for input(s): TSH, T4TOTAL, T3FREE, THYROIDAB in the last 72 hours.  Invalid input(s): FREET3   Coagulation profile No results for input(s): INR, PROTIME in the last 168 hours. ------------------------------------------------------------------------------------------------------------------- No results for input(s): DDIMER in the last 72 hours. -------------------------------------------------------------------------------------------------------------------  Cardiac Enzymes No results for input(s): CKMB, TROPONINI, MYOGLOBIN in the last 168 hours.  Invalid input(s): CK ------------------------------------------------------------------------------------------------------------------ Invalid input(s): POCBNP  ---------------------------------------------------------------------------------------------------------------  Urinalysis    Component Value Date/Time   COLORURINE STRAW (A) 12/28/2016 0003   APPEARANCEUR CLEAR (A) 12/28/2016 0003   APPEARANCEUR Clear 01/25/2013 1951   LABSPEC 1.004 (L) 12/28/2016 0003   LABSPEC 1.009 01/25/2013 1951   PHURINE 7.0 12/28/2016 0003   GLUCOSEU NEGATIVE 12/28/2016 0003   GLUCOSEU Negative 01/25/2013 1951   HGBUR MODERATE (A) 12/28/2016 0003   BILIRUBINUR NEGATIVE 12/28/2016 0003   BILIRUBINUR Negative 01/25/2013 1951   KETONESUR NEGATIVE 12/28/2016 0003   PROTEINUR NEGATIVE 12/28/2016 0003   NITRITE NEGATIVE 12/28/2016 0003   LEUKOCYTESUR LARGE (A) 12/28/2016 0003   LEUKOCYTESUR Negative 01/25/2013 1951     RADIOLOGY: No results found.  EKG: No  orders found for this or any previous visit.  IMPRESSION AND PLAN: 83 year old female patient with history of hypertension, hyperlipidemia presented to the emergency room with severe back pain and  dysuria. Admitting diagnosis 1. Intractable back pain 2. Urinary tract infection 3. Hypertension 4. Hyperlipidemia Treatment plan Admit patient to medical floor Control pain with Tylenol, tramadol and when necessary IV morphine IV Rocephin antibiotic 1 g daily Resume statin medication for hyperlipidemia Continue ACE inhibitor for hypertension  All the records are reviewed and case discussed with ED provider. Management plans discussed with the patient, family and they are in agreement.  CODE STATUS:FULL CODE Surrogate decision maker : daughter Code Status History    This patient does not have a recorded code status. Please follow your organizational policy for patients in this situation.       TOTAL TIME TAKING CARE OF THIS PATIENT: 50 minutes.    Ihor AustinPavan Sherlonda Flater M.D on 12/28/2016 at 4:10 AM  Between 7am to 6pm - Pager - [phone]  After 6pm go to [email or web address] - password [email or web address] [facility]  [place] [facility]  Office  [phone]  CC: Primary care physician; Rolm GalaGrandis, Heidi, MD

## 2016-12-28 NOTE — Evaluation (Signed)
Physical Therapy Evaluation Patient Details Name: Yolanda Casey MRN: [id number] DOB: 1925-09-19 Today's Date: 12/28/2016   History of Present Illness  Yolanda Casey is a 83 y.o. female with a known history of hyperlipidemia, hypertension presented to the emergency room with severe back pain. Patient was bending a lot and doing gardening for the last 2-3 days. She has been aggressively gardening for the last 3 days. She has more back pain and usually she takes Tylenol but it did not relieve pain. In the emergency room she was given Tylenol and tramadol and fentanyl but still she continued to have pain. She was not able to get up and ambulate. She was worked up and her urine showed infection and she received IV Rocephin antibiotic. Hospitalist service was consulted for further pain management. No numbness in any part of the body. No bowel or bladder incontinence. She was able to move all the extremities and strength was normal. At time of PT evaluation pt reports significant improvement in her back pain.   Clinical Impression  Pt reports significant improvement in her back pain since admission. Upon arrival pt had just finished ambulating. She is able to perform bed mobility and transfers independently and only requires supervision for ambulation with therapist without assistive device. She is able to ambulate a full lap around RN station. No reported increase in back pain. Able to perform gait speed changes and horizontal/vertical head turns without lateral deviation/stagger. Pt is mildly unsteady with 180 degree turns but able to self correct without external assist from therapist. She reports increase in unsteadiness while on pain medication. Pt denies any radicular pain at this time. Denies saddle paresthesia or worsened bowel/bladder incontinence. No pain with palpation of low back. Pt has history of chronic back pain. Discussed role of physical therapy in chronic back pain and back pain prevention. If  pain persists pt encouraged to follow-up with outpatient PT. No further PT needs identified. Will complete order. Please enter new order if needs arise or status changes.     Follow Up Recommendations Outpatient PT;Other (comment) (Per PCP if back pain continues)    Equipment Recommendations  None recommended by PT    Recommendations for Other Services       Precautions / Restrictions Precautions Precautions: Fall Restrictions Weight Bearing Restrictions: No      Mobility  Bed Mobility Overal bed mobility: Independent             General bed mobility comments: Good speed/sequencing. HOB minimally elevated, no need to use bed rails  Transfers Overall transfer level: Independent Equipment used: None             General transfer comment: Good speed, sequencing, and stability with transfers without an assitive device  Ambulation/Gait Ambulation/Gait assistance: Supervision Ambulation Distance (Feet): 220 Feet Assistive device: None Gait Pattern/deviations: WFL(Within Functional Limits) Gait velocity: WFL for community mobility Gait velocity interpretation: at or above normal speed for age/gender General Gait Details: Pt able to ambulate a full lap around RN station. No reported increase in back pain. Able to perform gait speed changes and horizontal/vertical head turns without lateral deviation/stagger. Pt is mildly unsteady with 180 degree turns but able to self correct. She reports increase in unsteadiness while on pain medication.   Stairs            Wheelchair Mobility    Modified Rankin (Stroke Patients Only)       Balance Overall balance assessment: Needs assistance Sitting-balance support: No upper extremity supported  Sitting balance-Leahy Scale: Good     Standing balance support: No upper extremity supported Standing balance-Leahy Scale: Good Standing balance comment: Negative Rhomberg, single leg balance 5-6 seconds                              Pertinent Vitals/Pain Pain Assessment: 0-10 Pain Score: 2  Pain Location: Bilateral lateral hips Pain Intervention(s): Monitored during session    Home Living Family/patient expects to be discharged to:: Private residence Living Arrangements: Children Available Help at Discharge: Family Type of Home: House Home Access: Level entry     Home Layout: Bed/bath upstairs;Two level Home Equipment: Grab bars - tub/shower (no assistive devices)      Prior Function Level of Independence: Independent         Comments: Independent with ambulation without assistive device. No falls. Independent with ADLs. Lives with family who assists with IADLs (meals, groceries)     Hand Dominance   Dominant Hand: Right    Extremity/Trunk Assessment   Upper Extremity Assessment Upper Extremity Assessment: Overall WFL for tasks assessed    Lower Extremity Assessment Lower Extremity Assessment: Overall WFL for tasks assessed       Communication   Communication: No difficulties  Cognition Arousal/Alertness: Awake/alert Behavior During Therapy: WFL for tasks assessed/performed Overall Cognitive Status: Within Functional Limits for tasks assessed                                        General Comments      Exercises     Assessment/Plan    PT Assessment Patent does not need any further PT services  PT Problem List         PT Treatment Interventions      PT Goals (Current goals can be found in the Care Plan section)  Acute Rehab PT Goals PT Goal Formulation: All assessment and education complete, DC therapy    Frequency     Barriers to discharge        Co-evaluation               AM-PAC PT "6 Clicks" Daily Activity  Outcome Measure Difficulty turning over in bed (including adjusting bedclothes, sheets and blankets)?: None Difficulty moving from lying on back to sitting on the side of the bed? : None Difficulty sitting down on and  standing up from a chair with arms (e.g., wheelchair, bedside commode, etc,.)?: None Help needed moving to and from a bed to chair (including a wheelchair)?: None Help needed walking in hospital room?: None Help needed climbing 3-5 steps with a railing? : None 6 Click Score: 24    End of Session Equipment Utilized During Treatment: Gait belt Activity Tolerance: Patient tolerated treatment well Patient left: in bed;with call bell/phone within reach;with bed alarm set;with family/visitor present   PT Visit Diagnosis: Pain Pain - Right/Left:  (bilateral) Pain - part of body:  (Back)    Time: 1610-9604 PT Time Calculation (min) (ACUTE ONLY): 23 min   Charges:   PT Evaluation $PT Eval Low Complexity: 1 Procedure     PT G Codes:   PT G-Codes **NOT FOR INPATIENT CLASS** Functional Assessment Tool Used: AM-PAC 6 Clicks Basic Mobility Functional Limitation: Mobility: Walking and moving around Mobility: Walking and Moving Around Current Status (V4098): 0 percent impaired, limited or restricted Mobility: Walking and Moving Around  Goal Status [phone](G8979): 0 percent impaired, limited or restricted Mobility: Walking and Moving Around Discharge Status [phone](G8980): 0 percent impaired, limited or restricted    Lynnea MaizesJason D Hartwell Vandiver PT, DPT    Adriyana Greenbaum 12/28/2016, 5:10 PM

## 2016-12-29 ENCOUNTER — Observation Stay: Payer: Medicare Other

## 2016-12-29 DIAGNOSIS — R531 Weakness: Secondary | ICD-10-CM

## 2016-12-29 DIAGNOSIS — M5136 Other intervertebral disc degeneration, lumbar region: Secondary | ICD-10-CM

## 2016-12-29 DIAGNOSIS — R8281 Pyuria: Secondary | ICD-10-CM

## 2016-12-29 DIAGNOSIS — M51369 Other intervertebral disc degeneration, lumbar region without mention of lumbar back pain or lower extremity pain: Secondary | ICD-10-CM

## 2016-12-29 DIAGNOSIS — R739 Hyperglycemia, unspecified: Secondary | ICD-10-CM

## 2016-12-29 DIAGNOSIS — M4800 Spinal stenosis, site unspecified: Secondary | ICD-10-CM

## 2016-12-29 LAB — URINE CULTURE: Culture: NO GROWTH

## 2016-12-29 LAB — HEMOGLOBIN A1C
Hgb A1c MFr Bld: 6.1 % — ABNORMAL HIGH (ref 4.8–5.6)
MEAN PLASMA GLUCOSE: 128 mg/dL

## 2016-12-29 MED ORDER — POLYETHYLENE GLYCOL 3350 17 G PO PACK
17.0000 g | PACK | Freq: Every day | ORAL | Status: DC
  Administered 2016-12-29: 17 g via ORAL
  Filled 2016-12-29: qty 1

## 2016-12-29 MED ORDER — POLYETHYLENE GLYCOL 3350 17 G PO PACK: 17.0000 g | PACK | Freq: Every day | ORAL | 0 refills | Status: DC

## 2016-12-29 MED ORDER — HYDROCODONE-ACETAMINOPHEN 5-325 MG PO TABS: 1.0000 | ORAL_TABLET | ORAL | Status: DC | PRN

## 2016-12-29 MED ORDER — HYDROCODONE-ACETAMINOPHEN 5-325 MG PO TABS: 1.0000 | ORAL_TABLET | ORAL | 0 refills | Status: DC | PRN

## 2016-12-29 MED ORDER — SENNOSIDES-DOCUSATE SODIUM 8.6-50 MG PO TABS: 1.0000 | ORAL_TABLET | Freq: Every evening | ORAL | 3 refills | Status: DC | PRN

## 2016-12-29 MED ORDER — HALOPERIDOL LACTATE 5 MG/ML IJ SOLN
2.0000 mg | Freq: Once | INTRAMUSCULAR | Status: AC
  Administered 2016-12-29: 2 mg via INTRAVENOUS
  Filled 2016-12-29: qty 0.4

## 2016-12-29 NOTE — Progress Notes (Signed)
Patient discharged to home. Concerns addressed. IV site removed. Prescriptions given to patient daughter.

## 2016-12-29 NOTE — Progress Notes (Addendum)
Pt alarm going off. Once cna entered room pt was upside down in bed with head hanging off and had an incontinence episode and very confused and not knowing where she was or who we were.  She was oriented to herself only and could not answer any other questions but was able to follow simple commands. Even after cleaning her up and putting her to bed pt was still confused about time, place, and situation but did say she doesn't know whats going on bc she cant think right. Pt placed in bed with alarm on and will cont to monitor. Pt was messing with her jewelry and I asked her if she wanted to take it off and she stated yes. While me and reesie (cna) were in room I removed a necklace and 2 rings, placed in bag and called sergeant mcadoo. He stated to put in bag and he would be around as soon as he could to lock up in safe. Pt still confused and only alert to self. Dr.diamond to be notified, has been paged.  Dr.diamond notified of pt cont confusion, acknowledged and new orders placed.

## 2016-12-29 NOTE — Care Management Obs Status (Signed)
MEDICARE OBSERVATION STATUS NOTIFICATION   Patient Details  Name: Yolanda Casey MRN: [id number] Date of Birth: 23-Feb-1926   Medicare Observation Status Notification Given:  Yes    Chapman FitchBOWEN, Aleen Marston T, RN 12/29/2016, 2:57 PM

## 2016-12-29 NOTE — Care Management (Signed)
Patient admitted for back pain.  Patient to discharge home today.  Patient lives at home with daughter.  No medical equipment.  PCP Grandis.  PT has assessed patient and recommends outpatient PT.  No RNCM needs identified, family to transport at discharge.

## 2016-12-29 NOTE — Discharge Summary (Signed)
[facility] - [place] [facility]   PATIENT NAME: Yolanda Casey    MR#:  [id number]  DATE OF BIRTH:  03-Feb-1926  DATE OF ADMISSION:  12/27/2016 ADMITTING PHYSICIAN: Ihor AustinPavan Pyreddy, MD  DATE OF DISCHARGE: 12/29/2016  3:44 PM  PRIMARY CARE PHYSICIAN: Rolm GalaGrandis, Heidi, MD     ADMISSION DIAGNOSIS:  Lower urinary tract infectious disease [N39.0] Intractable low back pain [M54.5] Chronic bilateral low back pain with right-sided sciatica [M54.41, G89.29] Acute bilateral low back pain with right-sided sciatica [M54.41]  DISCHARGE DIAGNOSIS:  Active Problems:   Back pain   Lumbar degenerative disc disease   Spinal stenosis   Pyuria, sterile   Hyperglycemia   Generalized weakness   SECONDARY DIAGNOSIS:   Past Medical History:  Diagnosis Date  . High cholesterol   . Hypertension     .pro HOSPITAL COURSE:  Patient is a 83 year old Caucasian female with past medical history significant for history of degenerative disease,moderate to severe central canal stenosis, disc prolapse, L1, L2, as per MRI of lumbar spine in March 2017, who presents to the hospital with worsening back pain. She denies any fall or injury. On arrival to the hospital, her labs were unremarkable, no radiologic studies were unfortunately performed, patient was initiated on opiates. Her labs revealed pyuria, concerning for urinary tract infection, however, no leukocytosis or fevers Or dysuria was reported. She initially was initiated on  intravenous Rocephin. MRI of the lumbar spine revealed chronic changes, including stable lumbar degenerative changes and grade 1 L4-5 anterolisthesis, mild L1-2, mild L2-3, moderate severe L3-4, and moderate to severe L4-5 canal stenosis, L2-3 through L5-S1 mild right and moderate left foraminal narrowing. Patient was seen by physical therapist and recommended outpatient physical therapy. We felt that patient would benefit from neurosurgical evaluation as  outpatient as well. She was felt to be stable to be discharged home. Discussion by problem: #1. Lower back pain with known history of degenerative disc disease, neural foraminal stenosis, spondylosis, central canal stenosis, disc protrusion at L1, L2, as of March 2017, no new changes on current lumbar MRI, continue Norco, physical therapy at home, , the patient was recommended to follow-up with neurosurgeon as outpatient for recommendations, physical therapist recommended outpatient physical therapy #2. Hyperglycemia, hemoglobin A1c was 6.1, patient has prediabetes, would benefit from a diabetic diet, weight loss, if possible #3. Pyuria, urine cultures were negative, no urinary tract infection, antibiotic was stopped, although patient was asymptomatic #4. Essential hypertension, continue Altace, advance medications as needed, blood pressure could be pain driven, reassess as outpatient  DISCHARGE CONDITIONS:   Stable  CONSULTS OBTAINED:    DRUG ALLERGIES:   Allergies  Allergen Reactions  . Omeprazole Other (See Comments)    Other Reaction: gi upset  . Atenolol Other (See Comments)    Other Reaction: DROP IN BP  . Levofloxacin Diarrhea  . Adhesive [Tape]   . Albuterol Other (See Comments)  . Atorvastatin Other (See Comments)  . Ciprofloxacin Other (See Comments)  . Codeine   . Diclofenac Other (See Comments)  . Fluoxetine Other (See Comments)  . Indomethacin Other (See Comments)  . Latex   . Losartan Other (See Comments)  . Oxybutynin Chloride Other (See Comments)  . Oxycodone-Acetaminophen Other (See Comments)  . Sucralfate Other (See Comments)  . Tramadol Diarrhea    Pt and daughter report it gives her diarrhea. Usually probiotics are given to her to help per daughter.   . Gabapentin Nausea Only    DISCHARGE MEDICATIONS:  Discharge Medication List as of 12/29/2016  2:51 PM    START taking these medications   Details  HYDROcodone-acetaminophen (NORCO/VICODIN) 5-325 MG  tablet Take 1 tablet by mouth every 4 (four) hours as needed for moderate pain or severe pain., Starting Thu 12/29/2016, Print    polyethylene glycol (MIRALAX / GLYCOLAX) packet Take 17 g by mouth daily., Starting Fri 12/30/2016, Normal    senna-docusate (SENOKOT-S) 8.6-50 MG tablet Take 1 tablet by mouth at bedtime as needed for mild constipation., Starting Thu 12/29/2016, Normal      CONTINUE these medications which have NOT CHANGED   Details  acetaminophen (TYLENOL) 500 MG tablet Take 1-2 tablets by mouth every 6 (six) hours as needed., Starting Thu 11/27/2008, Historical Med    aspirin EC 81 MG tablet Take 1 tablet by mouth daily., Starting Thu 11/27/2008, Historical Med    Multiple Vitamins-Minerals (CENTRUM SILVER) CHEW Chew 1 tablet by mouth 2 (two) times daily., Historical Med    psyllium (REGULOID) 0.52 g capsule Take 0.52 g by mouth daily., Historical Med    ramipril (ALTACE) 5 MG capsule Take 1 capsule by mouth daily., Starting Fri 12/16/2016, Historical Med    rosuvastatin (CRESTOR) 40 MG tablet Take 1 tablet by mouth daily., Starting Sun 10/23/2016, Historical Med    TOPROL XL 25 MG 24 hr tablet Take 0.5 tablets by mouth daily., Starting Thu 11/10/2016, Historical Med         DISCHARGE INSTRUCTIONS:    The patient is to follow-up with primary care physician and neurosurgeon as outpatient  If you experience worsening of your admission symptoms, develop shortness of breath, life threatening emergency, suicidal or homicidal thoughts you must seek medical attention immediately by calling 911 or calling your MD immediately  if symptoms less severe.  You Must read complete instructions/literature along with all the possible adverse reactions/side effects for all the Medicines you take and that have been prescribed to you. Take any new Medicines after you have completely understood and accept all the possible adverse reactions/side effects.   Please note  You were cared for by a  hospitalist during your hospital stay. If you have any questions about your discharge medications or the care you received while you were in the hospital after you are discharged, you can call the unit and asked to speak with the hospitalist on call if the hospitalist that took care of you is not available. Once you are discharged, your primary care physician will handle any further medical issues. Please note that NO REFILLS for any discharge medications will be authorized once you are discharged, as it is imperative that you return to your primary care physician (or establish a relationship with a primary care physician if you do not have one) for your aftercare needs so that they can reassess your need for medications and monitor your lab values.    Today   CHIEF COMPLAINT:   Chief Complaint  Patient presents with  . Back Pain    HISTORY OF PRESENT ILLNESS:    VITAL SIGNS:  Blood pressure (!) 147/64, pulse 75, temperature 98.3 F (36.8 C), temperature source Oral, resp. rate 16, height 5\' 3"  (1.6 m), weight 63.2 kg (139 lb 6.4 oz), SpO2 96 %.  I/O:   Intake/Output Summary (Last 24 hours) at 12/29/16 1622 Last data filed at 12/29/16 1412  Gross per 24 hour  Intake              713 ml  Output  2050 ml  Net            -1337 ml    PHYSICAL EXAMINATION:  GENERAL:  83 y.o.-year-old patient lying in the bed with no acute distress.  EYES: Pupils equal, round, reactive to light and accommodation. No scleral icterus. Extraocular muscles intact.  HEENT: Head atraumatic, normocephalic. Oropharynx and nasopharynx clear.  NECK:  Supple, no jugular venous distention. No thyroid enlargement, no tenderness.  LUNGS: Normal breath sounds bilaterally, no wheezing, rales,rhonchi or crepitation. No use of accessory muscles of respiration.  CARDIOVASCULAR: S1, S2 normal. No murmurs, rubs, or gallops.  ABDOMEN: Soft, non-tender, non-distended. Bowel sounds present. No organomegaly or mass.   EXTREMITIES: No pedal edema, cyanosis, or clubbing.  NEUROLOGIC: Cranial nerves II through XII are intact. Muscle strength 5/5 in all extremities. Sensation intact. Gait not checked.  PSYCHIATRIC: The patient is alert and oriented x 3.  SKIN: No obvious rash, lesion, or ulcer.   DATA REVIEW:   CBC  Recent Labs Lab 12/28/16 0108  WBC 8.8  HGB 14.2  HCT 41.6  PLT 200    Chemistries   Recent Labs Lab 12/28/16 0108  NA 137  K 3.9  CL 104  CO2 26  GLUCOSE 115*  BUN 20  CREATININE 0.95  CALCIUM 9.5    Cardiac Enzymes No results for input(s): TROPONINI in the last 168 hours.  Microbiology Results  Results for orders placed or performed during the hospital encounter of 12/27/16  Urine culture     Status: None   Collection Time: 12/28/16 12:03 AM  Result Value Ref Range Status   Specimen Description URINE, RANDOM  Final   Special Requests NONE  Final   Culture   Final    NO GROWTH Performed at [facility], [address] [place], [place] [address]    Report Status 12/29/2016 FINAL  Final    RADIOLOGY:  Mr Lumbar Spine Wo Contrast  Result Date: 12/29/2016 CLINICAL DATA:  83 y/o  F; severe lower back pain. EXAM: MRI LUMBAR SPINE WITHOUT CONTRAST TECHNIQUE: Multiplanar, multisequence MR imaging of the lumbar spine was performed. No intravenous contrast was administered. COMPARISON:  09/14/2015 lumbar spine MRI. FINDINGS: Segmentation:  Standard. Alignment:  Stable minimal grade 1 anterolisthesis at L4-5. Vertebrae: Mild degenerative endplate edema at U0-4 anteriorly. No evidence for fracture, diskitis, or suspicious bone lesion. Conus medullaris: Extends to the L2 level and appears normal. Paraspinal and other soft tissues: T2 hyperintense foci in the left kidney measuring up to 14 mm in the interpolar region are compatible with cysts. Disc levels: L1-2: Stable moderate disc bulge and mild facet hypertrophy. Mild canal stenosis with anterior cord impingement and  flattening. No significant foraminal narrowing. L2-3: Stable moderate disc bulge with mild bilateral ligamentum flavum and facet hypertrophy. Moderate left and mild right foraminal narrowing. Mild canal stenosis. L3-4: Stable moderate disc bulge with moderate ligamentum flavum and mild facet hypertrophy. Mild right and moderate left foraminal narrowing. Moderate to severe canal stenosis. L4-5: Stable anterolisthesis with uncovered disc bulge, moderate ligamentum flavum, and severe facet hypertrophy. Mild right and moderate left foraminal narrowing. Moderate to severe canal stenosis. L5-S1: Stable small disc bulge, marginal osteophytes, and bilateral bilateral facet hypertrophy. Mild right and moderate left foraminal narrowing. No significant canal stenosis. IMPRESSION: 1. No acute osseous abnormality. 2. Stable lumbar degenerative changes and grade 1 L4-5 anterolisthesis. 3. Mild L1-2, mild L2-3, moderate severe L3-4, and moderate to severe L4-5 canal stenosis. 4. L2-3 through L5-S1 mild right and  moderate left foraminal narrowing. Electronically Signed   By: Mitzi Hansen M.D.   On: 12/29/2016 01:55    EKG:  No orders found for this or any previous visit.    Management plans discussed with the patient, family and they are in agreement.  CODE STATUS:     Code Status Orders        Start     Ordered   12/28/16 0537  Full code  Continuous     12/28/16 0536    Code Status History    Date Active Date Inactive Code Status Order ID Comments User Context   This patient has a current code status but no historical code status.    Advance Directive Documentation     Most Recent Value  Type of Advance Directive  Living will  Pre-existing out of facility DNR order (yellow form or pink MOST form)  -  "MOST" Form in Place?  -      TOTAL TIME TAKING CARE OF THIS PATIENT: 40 minutes.    Katharina Caper M.D on 12/29/2016 at 4:23 PM  Between 7am to 6pm - Pager - [phone]  After 6pm  go to [email or web address] - password [email or web address] [facility]  [facility]  Office  [phone]  CC: Primary care physician; Rolm Gala, MD

## 2016-12-29 NOTE — Progress Notes (Signed)
Pt cont to be confused after haldol given and cont to try to get out of bed. Placing a telesitter in room to assist in extra eyes on pt until returns to baseline.

## 2018-11-29 ENCOUNTER — Emergency Department
Admission: EM | Admit: 2018-11-29 | Discharge: 2018-11-29 | Disposition: A | Discharge status: Home / Self Care | Payer: Medicare Other | Attending: Emergency Medicine | Admitting: Emergency Medicine

## 2018-11-29 ENCOUNTER — Emergency Department: Payer: Medicare Other

## 2018-11-29 ENCOUNTER — Encounter: Payer: Self-pay | Admitting: Emergency Medicine

## 2018-11-29 ENCOUNTER — Other Ambulatory Visit: Payer: Self-pay

## 2018-11-29 DIAGNOSIS — Y929 Unspecified place or not applicable: Secondary | ICD-10-CM | POA: Diagnosis not present

## 2018-11-29 DIAGNOSIS — I10 Essential (primary) hypertension: Secondary | ICD-10-CM | POA: Insufficient documentation

## 2018-11-29 DIAGNOSIS — Z7982 Long term (current) use of aspirin: Secondary | ICD-10-CM | POA: Diagnosis not present

## 2018-11-29 DIAGNOSIS — F0390 Unspecified dementia without behavioral disturbance: Secondary | ICD-10-CM | POA: Insufficient documentation

## 2018-11-29 DIAGNOSIS — M545 Low back pain, unspecified: Secondary | ICD-10-CM

## 2018-11-29 DIAGNOSIS — Z79899 Other long term (current) drug therapy: Secondary | ICD-10-CM | POA: Insufficient documentation

## 2018-11-29 DIAGNOSIS — Z91040 Latex allergy status: Secondary | ICD-10-CM | POA: Diagnosis not present

## 2018-11-29 DIAGNOSIS — Y999 Unspecified external cause status: Secondary | ICD-10-CM | POA: Diagnosis not present

## 2018-11-29 DIAGNOSIS — W1839XA Other fall on same level, initial encounter: Secondary | ICD-10-CM | POA: Insufficient documentation

## 2018-11-29 DIAGNOSIS — M25551 Pain in right hip: Secondary | ICD-10-CM | POA: Diagnosis not present

## 2018-11-29 DIAGNOSIS — S0990XA Unspecified injury of head, initial encounter: Secondary | ICD-10-CM | POA: Diagnosis not present

## 2018-11-29 DIAGNOSIS — Y9389 Activity, other specified: Secondary | ICD-10-CM | POA: Diagnosis not present

## 2018-11-29 HISTORY — DX: Unspecified dementia, unspecified severity, without behavioral disturbance, psychotic disturbance, mood disturbance, and anxiety: F03.90

## 2018-11-29 NOTE — ED Provider Notes (Signed)
[facility] Emergency Department Provider Note ____________________________________________  Time seen: Approximately 3:43 PM  I have reviewed the triage vital signs and the nursing notes.   HISTORY  Chief Complaint Fall    HPI Yolanda Casey is a 85 y.o. female who presents to the emergency department for evaluation and treatment of head injury and hip/back pain after falling this morning. She states that she was standing up to tie her shoes and lost her balance. She fell backward and struck her head. No loss of consciousness.. She arrived via EMS.  No alleviating measures attempted in route. Past Medical History:  Diagnosis Date  . Dementia (HCC)   . High cholesterol   . Hypertension     Patient Active Problem List   Diagnosis Date Noted  . Lumbar degenerative disc disease 12/29/2016  . Spinal stenosis 12/29/2016  . Pyuria, sterile 12/29/2016  . Hyperglycemia 12/29/2016  . Generalized weakness 12/29/2016  . Back pain 12/28/2016    Past Surgical History:  Procedure Laterality Date  . ABDOMINAL HYSTERECTOMY    . none      Prior to Admission medications   Medication Sig Start Date End Date Taking? Authorizing Provider  acetaminophen (TYLENOL) 500 MG tablet Take 1-2 tablets by mouth every 6 (six) hours as needed. 11/27/08   [provider]  aspirin EC 81 MG tablet Take 1 tablet by mouth daily. 11/27/08   [provider]  Multiple Vitamins-Minerals (CENTRUM SILVER) CHEW Chew 1 tablet by mouth 2 (two) times daily.    [provider]  polyethylene glycol (MIRALAX / GLYCOLAX) packet Take 17 g by mouth daily. 12/30/16   [name], [name], MD  psyllium (REGULOID) 0.52 g capsule Take 0.52 g by mouth daily.    [provider]  ramipril (ALTACE) 5 MG capsule Take 1 capsule by mouth daily. 12/16/16   [provider]  rosuvastatin (CRESTOR) 40 MG tablet Take 1 tablet by mouth daily. 10/23/16   [provider]   senna-docusate (SENOKOT-S) 8.6-50 MG tablet Take 1 tablet by mouth at bedtime as needed for mild constipation. 12/29/16   [name], [name], MD  TOPROL XL 25 MG 24 hr tablet Take 0.5 tablets by mouth daily. 11/10/16   [provider]    Allergies Omeprazole; Atenolol; Levofloxacin; Adhesive [tape]; Albuterol; Atorvastatin; Ciprofloxacin; Codeine; Diclofenac; Fluoxetine; Indomethacin; Latex; Losartan; Oxybutynin chloride; Oxycodone-acetaminophen; Sucralfate; Tramadol; and Gabapentin  Family History  Problem Relation Age of Onset  . Renal Disease Mother   . Heart failure Father     Social History Social History   Tobacco Use  . Smoking status: Never Smoker  . Smokeless tobacco: Never Used  Substance Use Topics  . Alcohol use: No  . Drug use: No    Review of Systems Constitutional: Negative for fever. Cardiovascular: Negative for chest pain. Respiratory: Negative for shortness of breath. Musculoskeletal: Positive for right hip and lower back pain. Skin: Negative for open wounds or lesions. Neurological: Negative for decrease in sensation  ____________________________________________   PHYSICAL EXAM:  VITAL SIGNS: ED Triage Vitals  Enc Vitals Group     BP 11/29/18 1041 (!) 152/76     Pulse Rate 11/29/18 1041 80     Resp 11/29/18 1041 18     Temp 11/29/18 1041 98.4 F (36.9 C)     Temp Source 11/29/18 1041 Oral     SpO2 11/29/18 1041 96 %     Weight 11/29/18 1042 140 lb (63.5 kg)     Height 11/29/18 1042  5\' 4"  (1.626 m)     Head Circumference --      Peak Flow --      Pain Score --      Pain Loc --      Pain Edu? --      Excl. in GC? --     Constitutional: Alert and oriented. Well appearing and in no acute distress. Eyes: Conjunctivae are clear without discharge or drainage Head: Atraumatic Neck: Supple.  No midline tenderness. Respiratory: No cough. Respirations are even and unlabored. Musculoskeletal: No midline tenderness along the length of the  spine.  There is diffuse transverse lumbar tenderness with an increase in pain with palpation over the right paraspinal muscles.  She is able to actively flex the knee and abduct the hip without complaint. Neurologic: Awake, alert, oriented to person place and time.  Able to verbalize today's incident. Skin: No open wounds or lesions are noted Psychiatric: Affect and behavior are appropriate.  ____________________________________________   LABS (all labs ordered are listed, but only abnormal results are displayed)  Labs Reviewed - No data to display ____________________________________________  RADIOLOGY  CT of the head and cervical spine is negative for acute abnormality.  Lumbar spine and right hip are negative for acute bony abnormality per radiology. ____________________________________________   PROCEDURES  Procedures  ____________________________________________   INITIAL IMPRESSION / ASSESSMENT AND PLAN / ED COURSE  Yolanda Casey is a 85 y.o. who presents to the emergency department for treatment and evaluation after mechanical, non-syncopal fall prior to arrival.  CT of the head and cervical spine is reassuring as is the x-ray of her lumbar spine and right hip.  Patient will take Tylenol every 4 hours if needed.  She is to see orthopedics or primary care for symptoms that are not improving over the week.  She is to return to the emergency department for symptoms of change or worsen if she is unable to schedule an appointment.  Medications - No data to display  Pertinent labs & imaging results that were available during my care of the patient were reviewed by me and considered in my medical decision making (see chart for details).  _________________________________________   FINAL CLINICAL IMPRESSION(S) / ED DIAGNOSES  Final diagnoses:  Acute lumbar back pain  Pain of right hip joint  Minor head injury, initial encounter    ED Discharge Orders    None        If controlled substance prescribed during this visit, 12 month history viewed on the [facility] prior to issuing an initial prescription for Schedule II or III opiod.   Chinita Pester, FNP 11/29/18 1553    Sharman Cheek, MD 12/02/18 0000

## 2018-11-29 NOTE — Discharge Instructions (Signed)
Take Tylenol every 4-6 hours if needed for pain.  Follow-up with your primary care provider for symptoms that are not improving over the week.  Rest and apply ice to sore areas 20 minutes/h while awake.  Return to the emergency department for symptoms that change or worsen if you are unable to schedule an appointment.

## 2018-11-29 NOTE — ED Triage Notes (Signed)
Presents vis EMS   S/p fall  Per EMS she fell straight back and hit the floor  Having pain to back of head and lower back /right hip area

## 2018-11-29 NOTE — ED Notes (Signed)
Discharge instructions reviewed with daughter.

## 2018-12-07 ENCOUNTER — Encounter: Payer: Self-pay | Admitting: Emergency Medicine

## 2018-12-07 ENCOUNTER — Emergency Department
Admission: EM | Admit: 2018-12-07 | Discharge: 2018-12-07 | Disposition: A | Discharge status: Home / Self Care | Payer: Medicare Other | Attending: Emergency Medicine | Admitting: Emergency Medicine

## 2018-12-07 ENCOUNTER — Other Ambulatory Visit: Payer: Self-pay

## 2018-12-07 ENCOUNTER — Emergency Department: Payer: Medicare Other

## 2018-12-07 DIAGNOSIS — W108XXA Fall (on) (from) other stairs and steps, initial encounter: Secondary | ICD-10-CM | POA: Insufficient documentation

## 2018-12-07 DIAGNOSIS — N390 Urinary tract infection, site not specified: Secondary | ICD-10-CM | POA: Diagnosis not present

## 2018-12-07 DIAGNOSIS — Z91040 Latex allergy status: Secondary | ICD-10-CM | POA: Diagnosis not present

## 2018-12-07 DIAGNOSIS — Z79899 Other long term (current) drug therapy: Secondary | ICD-10-CM | POA: Diagnosis not present

## 2018-12-07 DIAGNOSIS — Z87891 Personal history of nicotine dependence: Secondary | ICD-10-CM | POA: Diagnosis not present

## 2018-12-07 DIAGNOSIS — W19XXXA Unspecified fall, initial encounter: Secondary | ICD-10-CM

## 2018-12-07 DIAGNOSIS — F0390 Unspecified dementia without behavioral disturbance: Secondary | ICD-10-CM | POA: Insufficient documentation

## 2018-12-07 DIAGNOSIS — M545 Low back pain: Secondary | ICD-10-CM | POA: Diagnosis not present

## 2018-12-07 DIAGNOSIS — I10 Essential (primary) hypertension: Secondary | ICD-10-CM | POA: Insufficient documentation

## 2018-12-07 DIAGNOSIS — Z7982 Long term (current) use of aspirin: Secondary | ICD-10-CM | POA: Diagnosis not present

## 2018-12-07 LAB — BASIC METABOLIC PANEL
Anion gap: 8 (ref 5–15)
BUN: 31 mg/dL — ABNORMAL HIGH (ref 8–23)
CO2: 26 mmol/L (ref 22–32)
Calcium: 9.4 mg/dL (ref 8.9–10.3)
Chloride: 105 mmol/L (ref 98–111)
Creatinine, Ser: 0.86 mg/dL (ref 0.44–1.00)
GFR calc Af Amer: >60 mL/min (ref >60)
GFR calc non Af Amer: 59 mL/min — ABNORMAL LOW (ref >60)
Glucose, Bld: 146 mg/dL — ABNORMAL HIGH (ref 70–99)
Potassium: 4.4 mmol/L (ref 3.5–5.1)
Sodium: 139 mmol/L (ref 135–145)

## 2018-12-07 LAB — URINALYSIS, COMPLETE (UACMP) WITH MICROSCOPIC
Bilirubin Urine: NEGATIVE
Glucose, UA: NEGATIVE mg/dL
Hgb urine dipstick: NEGATIVE
Ketones, ur: NEGATIVE mg/dL
Nitrite: POSITIVE — AB
Protein, ur: 30 mg/dL — AB
Specific Gravity, Urine: 1.017 (ref 1.005–1.030)
WBC, UA: >50 WBC/hpf — ABNORMAL HIGH (ref 0–5)
pH: 5 (ref 5.0–8.0)

## 2018-12-07 LAB — CBC WITH DIFFERENTIAL/PLATELET
Abs Immature Granulocytes: 0.03 10*3/uL (ref 0.00–0.07)
Basophils Absolute: 0 10*3/uL (ref 0.0–0.1)
Basophils Relative: 1 %
Eosinophils Absolute: 0.2 10*3/uL (ref 0.0–0.5)
Eosinophils Relative: 2 %
HCT: 39.1 % (ref 36.0–46.0)
Hemoglobin: 13.3 g/dL (ref 12.0–15.0)
Immature Granulocytes: 0 %
Lymphocytes Relative: 38 %
Lymphs Abs: 3.3 10*3/uL (ref 0.7–4.0)
MCH: 34.1 pg — ABNORMAL HIGH (ref 26.0–34.0)
MCHC: 34 g/dL (ref 30.0–36.0)
MCV: 100.3 fL — ABNORMAL HIGH (ref 80.0–100.0)
Monocytes Absolute: 1.1 10*3/uL — ABNORMAL HIGH (ref 0.1–1.0)
Monocytes Relative: 13 %
Neutro Abs: 4 10*3/uL (ref 1.7–7.7)
Neutrophils Relative %: 46 %
Platelets: 195 10*3/uL (ref 150–400)
RBC: 3.9 MIL/uL (ref 3.87–5.11)
RDW: 14.3 % (ref 11.5–15.5)
WBC: 8.6 10*3/uL (ref 4.0–10.5)
nRBC: 0 % (ref 0.0–0.2)

## 2018-12-07 LAB — TROPONIN I: Troponin I: <0.03 ng/mL (ref <0.03)

## 2018-12-07 MED ORDER — CEPHALEXIN 500 MG PO CAPS: 500.0000 mg | ORAL_CAPSULE | Freq: Three times a day (TID) | ORAL | 0 refills | Status: DC

## 2018-12-07 MED ORDER — SODIUM CHLORIDE 0.9 % IV SOLN
1.0000 g | Freq: Once | INTRAVENOUS | Status: AC
  Administered 2018-12-07: 23:00:00 1 g via INTRAVENOUS
  Filled 2018-12-07: qty 10

## 2018-12-07 NOTE — ED Provider Notes (Signed)
[facility] Emergency Department Provider Note  ____________________________________________   I have reviewed the triage vital signs and the nursing notes.   HISTORY  Chief Complaint Fall   History limited by: Not Limited   HPI Yolanda Casey is a 85 y.o. female who presents to the emergency department today complaining of back pain after a fall. The patient states that she was on stairs when it happened. She is not sure why she fell. The patient is complaining of pain in her back, but primarily her lower back. The patient states she has some chronic back problems due to spinal stenosis. Patient denies hitting her head or losing consciousness. She denies any recent fever, shortness of breath or chest pain.    Records reviewed. Per medical record review patient has a history of dementia, HTN, HLD.   Past Medical History:  Diagnosis Date  . Dementia (HCC)   . High cholesterol   . Hypertension     Patient Active Problem List   Diagnosis Date Noted  . Lumbar degenerative disc disease 12/29/2016  . Spinal stenosis 12/29/2016  . Pyuria, sterile 12/29/2016  . Hyperglycemia 12/29/2016  . Generalized weakness 12/29/2016  . Back pain 12/28/2016    Past Surgical History:  Procedure Laterality Date  . ABDOMINAL HYSTERECTOMY    . none      Prior to Admission medications   Medication Sig Start Date End Date Taking? Authorizing Provider  acetaminophen (TYLENOL) 500 MG tablet Take 1-2 tablets by mouth every 6 (six) hours as needed. 11/27/08   [provider]  aspirin EC 81 MG tablet Take 1 tablet by mouth daily. 11/27/08   [provider]  Multiple Vitamins-Minerals (CENTRUM SILVER) CHEW Chew 1 tablet by mouth 2 (two) times daily.    [provider]  polyethylene glycol (MIRALAX / GLYCOLAX) packet Take 17 g by mouth daily. 12/30/16   [name], [name], MD  psyllium (REGULOID) 0.52 g capsule Take 0.52 g by mouth daily.    [provider]  ramipril (ALTACE) 5 MG capsule Take 1 capsule by mouth daily. 12/16/16   [provider]  rosuvastatin (CRESTOR) 40 MG tablet Take 1 tablet by mouth daily. 10/23/16   [provider]  senna-docusate (SENOKOT-S) 8.6-50 MG tablet Take 1 tablet by mouth at bedtime as needed for mild constipation. 12/29/16   [name], [name], MD  TOPROL XL 25 MG 24 hr tablet Take 0.5 tablets by mouth daily. 11/10/16   [provider]    Allergies Omeprazole; Atenolol; Levofloxacin; Adhesive [tape]; Albuterol; Atorvastatin; Ciprofloxacin; Codeine; Diclofenac; Fluoxetine; Indomethacin; Latex; Losartan; Oxybutynin chloride; Oxycodone-acetaminophen; Sucralfate; Tramadol; and Gabapentin  Family History  Problem Relation Age of Onset  . Renal Disease Mother   . Heart failure Father     Social History Social History   Tobacco Use  . Smoking status: Never Smoker  . Smokeless tobacco: Never Used  Substance Use Topics  . Alcohol use: No  . Drug use: No    Review of Systems Constitutional: No fever/chills Eyes: No visual changes. ENT: No sore throat. Cardiovascular: Denies chest pain. Respiratory: Denies shortness of breath. Gastrointestinal: No abdominal pain.  No nausea, no vomiting.  No diarrhea.   Genitourinary: Negative for dysuria. Musculoskeletal: Positive for back pain.  Skin: Negative for rash. Neurological: Negative for headaches, focal weakness or numbness.  ____________________________________________   PHYSICAL EXAM:  VITAL SIGNS: ED Triage Vitals  Enc Vitals Group     BP 12/07/18 2012 (!) 146/77  Pulse Rate 12/07/18 2012 90     Resp 12/07/18 2012 17     Temp 12/07/18 2012 97.8 F (36.6 C)     Temp Source 12/07/18 2012 Oral     SpO2 12/07/18 2005 96 %     Weight 12/07/18 2013 139 lb 15.9 oz (63.5 kg)     Height 12/07/18 2013 5\' 4"  (1.626 m)     Head Circumference --      Peak Flow --      Pain Score 12/07/18 2013 0   Constitutional:  Alert and oriented.  Eyes: Conjunctivae are normal.  ENT      Head: Normocephalic and atraumatic.      Nose: No congestion/rhinnorhea.      Mouth/Throat: Mucous membranes are moist.      Neck: No stridor. No midline tenderness.  Hematological/Lymphatic/Immunilogical: No cervical lymphadenopathy. Cardiovascular: Normal rate, regular rhythm.  No murmurs, rubs, or gallops. Respiratory: Normal respiratory effort without tachypnea nor retractions. Breath sounds are clear and equal bilaterally. No wheezes/rales/rhonchi. Gastrointestinal: Soft and non tender. No rebound. No guarding.  Genitourinary: Deferred Musculoskeletal: Normal range of motion in all extremities. No lower extremity edema. No spinal tenderness.  Neurologic:  Normal speech and language. No gross focal neurologic deficits are appreciated.  Skin:  Skin is warm, dry and intact. No rash noted. Psychiatric: Mood and affect are normal. Speech and behavior are normal. Patient exhibits appropriate insight and judgment.  ____________________________________________    LABS (pertinent positives/negatives)  Trop <0.03 BMP wnl except glu 146, BUN 31 CBC wbc 8.6, hgb 13.3, plt 195 UA cloudy, nitrite positive, large leukocytes, >50 WBC, few bacteria  ____________________________________________   EKG  None  ____________________________________________    RADIOLOGY  Lumbar spine No acute osseous injury  ____________________________________________   PROCEDURES  Procedures  ____________________________________________   INITIAL IMPRESSION / ASSESSMENT AND PLAN / ED COURSE  Pertinent labs & imaging results that were available during my care of the patient were reviewed by me and considered in my medical decision making (see chart for details).   Patient presented to the emergency department today because of concern for low back pain after a fall. The patient had lumbar spine x-ray which did not show any acute  injury. Patient's urine however is consistent with an infection. Do wonder if this played a role in perhaps both the fall and the discomfort. Will give dose of IV abx here and discharge with further abx. Discussed findings and plan with patient.  __________________________________________   FINAL CLINICAL IMPRESSION(S) / ED DIAGNOSES  Final diagnoses:  Fall, initial encounter  Lower urinary tract infectious disease     Note: This dictation was prepared with Nurse, children's dictation. Any transcriptional errors that result from this process are unintentional     Phineas Semen, MD 12/07/18 2214

## 2018-12-07 NOTE — ED Notes (Signed)
Patient's daughter notified of UTI diagnosis and antibiotics prescribed. Patient's son in law will pick her up

## 2018-12-07 NOTE — ED Notes (Signed)
Patient transported to X-ray 

## 2018-12-07 NOTE — ED Triage Notes (Signed)
Arrives for fall earlier this evening, complains of tailbone and lower back pain. Was recently seen in the ED for a fall earlier this week. Patient reports no pain at this time. Did not hit head or lose consciousness

## 2018-12-07 NOTE — Discharge Instructions (Addendum)
Please seek medical attention for any high fevers, chest pain, shortness of breath, change in behavior, persistent vomiting, bloody stool or any other new or concerning symptoms.  

## 2019-02-10 ENCOUNTER — Other Ambulatory Visit: Payer: Self-pay

## 2019-02-10 ENCOUNTER — Emergency Department
Admission: EM | Admit: 2019-02-10 | Discharge: 2019-02-10 | Disposition: A | Discharge status: Home / Self Care | Payer: Medicare Other | Attending: Emergency Medicine | Admitting: Emergency Medicine

## 2019-02-10 ENCOUNTER — Emergency Department: Payer: Medicare Other

## 2019-02-10 ENCOUNTER — Encounter: Payer: Self-pay | Admitting: Emergency Medicine

## 2019-02-10 DIAGNOSIS — Z20828 Contact with and (suspected) exposure to other viral communicable diseases: Secondary | ICD-10-CM | POA: Insufficient documentation

## 2019-02-10 DIAGNOSIS — I10 Essential (primary) hypertension: Secondary | ICD-10-CM | POA: Insufficient documentation

## 2019-02-10 DIAGNOSIS — Z7982 Long term (current) use of aspirin: Secondary | ICD-10-CM | POA: Insufficient documentation

## 2019-02-10 DIAGNOSIS — R0789 Other chest pain: Secondary | ICD-10-CM

## 2019-02-10 DIAGNOSIS — F0390 Unspecified dementia without behavioral disturbance: Secondary | ICD-10-CM | POA: Diagnosis not present

## 2019-02-10 DIAGNOSIS — Z91040 Latex allergy status: Secondary | ICD-10-CM | POA: Insufficient documentation

## 2019-02-10 DIAGNOSIS — Z79899 Other long term (current) drug therapy: Secondary | ICD-10-CM | POA: Insufficient documentation

## 2019-02-10 DIAGNOSIS — M79602 Pain in left arm: Secondary | ICD-10-CM | POA: Diagnosis present

## 2019-02-10 HISTORY — DX: Atherosclerotic heart disease of native coronary artery without angina pectoris: I25.10

## 2019-02-10 HISTORY — DX: Cerebral infarction, unspecified: I63.9

## 2019-02-10 LAB — CBC WITH DIFFERENTIAL/PLATELET
Abs Immature Granulocytes: 0.01 10*3/uL (ref 0.00–0.07)
Basophils Absolute: 0.1 10*3/uL (ref 0.0–0.1)
Basophils Relative: 1 %
Eosinophils Absolute: 0.3 10*3/uL (ref 0.0–0.5)
Eosinophils Relative: 4 %
HCT: 39.7 % (ref 36.0–46.0)
Hemoglobin: 13.2 g/dL (ref 12.0–15.0)
Immature Granulocytes: 0 %
Lymphocytes Relative: 52 %
Lymphs Abs: 4.1 10*3/uL — ABNORMAL HIGH (ref 0.7–4.0)
MCH: 33.8 pg (ref 26.0–34.0)
MCHC: 33.2 g/dL (ref 30.0–36.0)
MCV: 101.8 fL — ABNORMAL HIGH (ref 80.0–100.0)
Monocytes Absolute: 0.9 10*3/uL (ref 0.1–1.0)
Monocytes Relative: 11 %
Neutro Abs: 2.5 10*3/uL (ref 1.7–7.7)
Neutrophils Relative %: 32 %
Platelets: 203 10*3/uL (ref 150–400)
RBC: 3.9 MIL/uL (ref 3.87–5.11)
RDW: 13.9 % (ref 11.5–15.5)
WBC: 7.8 10*3/uL (ref 4.0–10.5)
nRBC: 0 % (ref 0.0–0.2)

## 2019-02-10 LAB — LIPASE, BLOOD: Lipase: 77 U/L — ABNORMAL HIGH (ref 11–51)

## 2019-02-10 LAB — APTT: aPTT: 27 seconds (ref 24–36)

## 2019-02-10 LAB — COMPREHENSIVE METABOLIC PANEL
ALT: 24 U/L (ref 0–44)
AST: 23 U/L (ref 15–41)
Albumin: 4.2 g/dL (ref 3.5–5.0)
Alkaline Phosphatase: 67 U/L (ref 38–126)
Anion gap: 9 (ref 5–15)
BUN: 28 mg/dL — ABNORMAL HIGH (ref 8–23)
CO2: 24 mmol/L (ref 22–32)
Calcium: 9 mg/dL (ref 8.9–10.3)
Chloride: 104 mmol/L (ref 98–111)
Creatinine, Ser: 0.61 mg/dL (ref 0.44–1.00)
GFR calc Af Amer: >60 mL/min (ref >60)
GFR calc non Af Amer: >60 mL/min (ref >60)
Glucose, Bld: 106 mg/dL — ABNORMAL HIGH (ref 70–99)
Potassium: 3.9 mmol/L (ref 3.5–5.1)
Sodium: 137 mmol/L (ref 135–145)
Total Bilirubin: 0.6 mg/dL (ref 0.3–1.2)
Total Protein: 6.9 g/dL (ref 6.5–8.1)

## 2019-02-10 LAB — SARS CORONAVIRUS 2 BY RT PCR (HOSPITAL ORDER, PERFORMED IN ~~LOC~~ HOSPITAL LAB): SARS Coronavirus 2: NEGATIVE

## 2019-02-10 LAB — TROPONIN I (HIGH SENSITIVITY)
Troponin I (High Sensitivity): 14 ng/L (ref <18)
Troponin I (High Sensitivity): 17 ng/L (ref <18)

## 2019-02-10 LAB — PROTIME-INR
INR: 1 (ref 0.8–1.2)
Prothrombin Time: 12.7 seconds (ref 11.4–15.2)

## 2019-02-10 NOTE — ED Notes (Signed)
Pt reports headache above left eye for 1 week, intermittent; rates 1-2/10

## 2019-02-10 NOTE — ED Notes (Signed)
Portable chest x ray completed.

## 2019-02-10 NOTE — ED Notes (Signed)
Pt incontinent of urine; says she didn't want to bother me when she needed to void as I'd just have to keep coming in; cleaned well; clean attends in place; pt's daughter is on the way to pick her up

## 2019-02-10 NOTE — ED Provider Notes (Signed)
[facility] Emergency Department Provider Note  ____________________________________________   First MD Initiated Contact with Patient 02/10/19 1906     (approximate)  I have reviewed the triage vital signs and the nursing notes.   HISTORY  Chief Complaint left arm pain    HPI Yolanda Casey is a 85 y.o. female with dementia who is s/p Stents and bypass who presents with EMS for report of chest pain.  Patient does have some baseline dementia however it was reported that she had left arm pain.  Patient was given nitro as well as aspirin.  She denies any pain currently. Pt describes the pain as aching sensation in her wrist and her elbow that is intermittent, nothing makes it better or worse.  Denies exertional chest pain.   Discussed with pts daughter.  This morning she had severe pain in her left shoulder. Given tylenol and let her rest.  Then she was doing fine.  Then occasionally was saying her shoulder was bothering her.  Then she said the pain is radiating down her arm, and wasn't sure if this was a heart attack.     Past Medical History:  Diagnosis Date   Dementia ([place])    High cholesterol    Hypertension     Patient Active Problem List   Diagnosis Date Noted   Lumbar degenerative disc disease 12/29/2016   Spinal stenosis 12/29/2016   Pyuria, sterile 12/29/2016   Hyperglycemia 12/29/2016   Generalized weakness 12/29/2016   Back pain 12/28/2016    Past Surgical History:  Procedure Laterality Date   ABDOMINAL HYSTERECTOMY     none      Prior to Admission medications   Medication Sig Start Date End Date Taking? Authorizing Provider  acetaminophen (TYLENOL) 500 MG tablet Take 1-2 tablets by mouth every 6 (six) hours as needed. 11/27/08   [provider]  aspirin EC 81 MG tablet Take 1 tablet by mouth daily. 11/27/08   [provider]  cephALEXin (KEFLEX) 500 MG capsule Take 1 capsule (500 mg total) by mouth 3  (three) times daily. 12/07/18   Nance Pear, MD  Multiple Vitamins-Minerals (CENTRUM SILVER) CHEW Chew 1 tablet by mouth 2 (two) times daily.    [provider]  polyethylene glycol (MIRALAX / GLYCOLAX) packet Take 17 g by mouth daily. 12/30/16   Theodoro Grist, MD  psyllium (REGULOID) 0.52 g capsule Take 0.52 g by mouth daily.    [provider]  ramipril (ALTACE) 5 MG capsule Take 1 capsule by mouth daily. 12/16/16   [provider]  rosuvastatin (CRESTOR) 40 MG tablet Take 1 tablet by mouth daily. 10/23/16   [provider]  senna-docusate (SENOKOT-S) 8.6-50 MG tablet Take 1 tablet by mouth at bedtime as needed for mild constipation. 12/29/16   Theodoro Grist, MD  TOPROL XL 25 MG 24 hr tablet Take 0.5 tablets by mouth daily. 11/10/16   [provider]    Allergies Omeprazole, Atenolol, Levofloxacin, Adhesive [tape], Albuterol, Atorvastatin, Ciprofloxacin, Codeine, Diclofenac, Fluoxetine, Indomethacin, Latex, Losartan, Oxybutynin chloride, Oxycodone-acetaminophen, Sucralfate, Tramadol, and Gabapentin  Family History  Problem Relation Age of Onset   Renal Disease Mother    Heart failure Father     Social History Social History   Tobacco Use   Smoking status: Never Smoker   Smokeless tobacco: Never Used  Substance Use Topics   Alcohol use: No   Drug use: No      Review of Systems Constitutional: No fever/chills Eyes: No  visual changes. ENT: No sore throat. Cardiovascular: Positive chest pain, arm pain Respiratory: Denies shortness of breath. Gastrointestinal: No abdominal pain.  No nausea, no vomiting.  No diarrhea.  No constipation. Genitourinary: Negative for dysuria. Musculoskeletal: Negative for back pain. Skin: Negative for rash. Neurological: Negative for headaches, focal weakness or numbness. All other ROS negative ____________________________________________   PHYSICAL EXAM:  VITAL SIGNS: Blood pressure (!)  163/90, pulse 75, temperature 98.2 F (36.8 C), temperature source Oral, resp. rate 15, weight 65.7 kg, SpO2 98 %.  Constitutional: Alert and oriented. Well appearing and in no acute distress. Eyes: Conjunctivae are normal. EOMI. Head: Atraumatic. Nose: No congestion/rhinnorhea. Mouth/Throat: Mucous membranes are moist.   Neck: No stridor. Trachea Midline. FROM Cardiovascular: Normal rate, regular rhythm. Grossly normal heart sounds.  Good peripheral circulation. Respiratory: Normal respiratory effort.  No retractions. Lungs CTAB. Gastrointestinal: Soft and nontender. No distention. No abdominal bruits.  Musculoskeletal: No lower extremity tenderness nor edema.  No joint effusions. Full ROM of all joints. Neurologic:  Normal speech and language. No gross focal neurologic deficits are appreciated.  Skin:  Skin is warm, dry and intact. No rash noted. Psychiatric: Mood and affect are normal. Speech and behavior are normal. GU: Deferred   ____________________________________________   LABS (all labs ordered are listed, but only abnormal results are displayed)  Labs Reviewed  CBC WITH DIFFERENTIAL/PLATELET - Abnormal; Notable for the following components:      Result Value   MCV 101.8 (*)    Lymphs Abs 4.1 (*)    All other components within normal limits  COMPREHENSIVE METABOLIC PANEL - Abnormal; Notable for the following components:   Glucose, Bld 106 (*)    BUN 28 (*)    All other components within normal limits  LIPASE, BLOOD - Abnormal; Notable for the following components:   Lipase 77 (*)    All other components within normal limits  SARS CORONAVIRUS 2 (HOSPITAL ORDER, PERFORMED IN [facility])  PROTIME-INR  APTT  TROPONIN I (HIGH SENSITIVITY)  TROPONIN I (HIGH SENSITIVITY)   ____________________________________________   ED ECG REPORT I, Concha SeMary E Wynell Halberg, the attending physician, personally viewed and interpreted this ECG. Normal sinus rate of 82, no st  elevation, no twi, occasional PVCs and PACs  ____________________________________________  RADIOLOGY Vela ProseI, Franci Oshana E Jhordan Kinter, personally viewed and evaluated these images (plain radiographs) as part of my medical decision making, as well as reviewing the written report by the radiologist.  ED MD interpretation:  Xray no PNA  Official radiology report(s): Ct Head Wo Contrast  Result Date: 02/10/2019 CLINICAL DATA:  Acute onset of confusion. EXAM: CT HEAD WITHOUT CONTRAST TECHNIQUE: Contiguous axial images were obtained from the base of the skull through the vertex without intravenous contrast. COMPARISON:  Nov 29, 2018 FINDINGS: Brain: No evidence of acute infarction, hemorrhage, hydrocephalus, extra-axial collection or mass lesion/mass effect. Moderate brain parenchymal volume loss and deep white matter microangiopathy. Vascular: Intracranial calcific atherosclerotic disease. Skull: Normal. Negative for fracture or focal lesion. Sinuses/Orbits: No acute finding. Other: None. IMPRESSION: 1. No acute intracranial abnormality. 2. Atrophy, chronic microvascular disease. Electronically Signed   By: Ted Mcalpineobrinka  Dimitrova M.D.   On: 02/10/2019 20:24   Dg Chest Portable 1 View  Result Date: 02/10/2019 CLINICAL DATA:  Chest pain. EXAM: PORTABLE CHEST 1 VIEW COMPARISON:  January 05, 2010 FINDINGS: The heart size and mediastinal contours are within normal limits. Both lungs are clear. The visualized skeletal structures are unremarkable. IMPRESSION: No active disease. Electronically Signed   By:  Gerome Samavid  Williams III M.D   On: 02/10/2019 19:44    ____________________________________________   PROCEDURES  Procedure(s) performed (including Critical Care):  Procedures   ____________________________________________   INITIAL IMPRESSION / ASSESSMENT AND PLAN / ED COURSE   Yolanda Casey was evaluated in Emergency Department on 02/10/2019 for the symptoms described in the history of present illness. She was evaluated  in the context of the global COVID-19 pandemic, which necessitated consideration that the patient might be at risk for infection with the SARS-CoV-2 virus that causes COVID-19. Institutional protocols and algorithms that pertain to the evaluation of patients at risk for COVID-19 are in a state of rapid change based on information released by regulatory bodies including the CDC and federal and state organizations. These policies and algorithms were followed during the patient's care in the ED.    Most Likely DDx:  -MSK (atypical chest pain) but will get cardiac markers to evaluate for ACS given risk factors/age. No evidence of swollen joints to suggest septic joint/gout etc.        DDx that was also considered d/t potential to cause harm, but was found less likely based on history and physical (as detailed above): -PNA (no fevers, cough but CXR to evaluate) -PNX (reassured with equal b/l breath sounds, CXR to evaluate) -Symptomatic anemia (will get H&H) -Pulmonary embolism as no sob at rest, not pleuritic in nature, no hypoxia -Aortic Dissection as no tearing pain and no radiation to the mid back, pulses equal -Pericarditis no rub on exam, EKG changes or hx to suggest dx -Tamponade (no notable SOB, tachycardic, hypotensive) -Esophageal rupture (no h/o diffuse vomitting/no crepitus)    Had a lengthy discussion with patient's daughter who is her POA.  I discussed with patient that given her age and cardiac risk factors that she would meet criteria for admission with her cardiac marker being 14 to trend out her cardiac markers and keep her on the monitor.  Patient's daughter said that she would like to have the patient come home as long as there was not obvious evidence of ischemia.  She would prefer that we repeat the troponin and if stable discharge home.  She understands that she could have a heart attack and die but she understands that she is 85 years old with severe dementia and that they may  not want more aggressive measures at this time.   White count normal. NO evidence of Anemia. CT head negative. Chest xray negative for PNA. No Covid.   Trop 14-->17  We discussed with patient's family that the cardiac marker went from 14-17.  Given this very minimal change I rediscussed with patient's daughter.  Given the minimal change and patient is not having any chest pain at this time she would prefer to take patient home and to further monitor.  They understand that admission is typically warranted for those with high risk and that she could get worse and die but at this time they do not want further interventions given stable troponins.  I discussed the provisional nature of ED diagnosis, the treatment so far, the ongoing plan of care, follow up appointments and return precautions with the patient and any family or support people present. They expressed understanding and agreed with the plan, discharged home.          ____________________________________________   FINAL CLINICAL IMPRESSION(S) / ED DIAGNOSES   Final diagnoses:  Atypical chest pain     MEDICATIONS GIVEN DURING THIS VISIT:  Medications - No data  to display   ED Discharge Orders    None       Note:  This document was prepared using Dragon voice recognition software and may include unintentional dictation errors.   Concha SeFunke, Chevette Fee E, MD 02/11/19 [phone]

## 2019-02-10 NOTE — ED Notes (Signed)
Dr. Funke at bedside. 

## 2019-02-10 NOTE — Discharge Instructions (Addendum)
Patient came in for some chest pain and headaches.  Patient with a CT head that was negative as well as a chest x-ray that did not show any evidence pneumonia.  She was coronavirus negative.  Her cardiac marker stayed about the same.  We discussed admission to trend out her cardiac markers however we decided on having patient discharged home.  You should call your cardiologist on Monday to get a follow-up appointment and return to the ER if she develops return of chest pain or any other concerns.

## 2019-02-10 NOTE — ED Triage Notes (Addendum)
EMS pt from home-called out for c/o chest pain; pt reports to staff she's actually having left arm pain; pt given aspirin and nitro spray prior to arrival; pt's pain was 7/10 and then pain free after Nitro;  pt confused to time/date; oriented to person and place; talking in complete coherent sentences;

## 2019-02-10 NOTE — ED Notes (Signed)
Pt using call bell; reports increased pain to left elbow; denies injury; no swelling or redness noted to area; full ROM without increased pain;

## 2019-02-10 NOTE — ED Notes (Signed)
Spoke with pt's daughter; she already had been contacted by someone and knows we're waiting on one test results before MD disposition decision

## 2019-02-10 NOTE — ED Notes (Signed)
Pt informed I have contacted her daughter and what the plan is; no complaints or requests at this time

## 2019-02-28 ENCOUNTER — Other Ambulatory Visit: Payer: Self-pay

## 2019-02-28 ENCOUNTER — Emergency Department: Payer: Medicare Other

## 2019-02-28 ENCOUNTER — Emergency Department
Admission: EM | Admit: 2019-02-28 | Discharge: 2019-02-28 | Disposition: A | Discharge status: Home / Self Care | Payer: Medicare Other | Attending: Student | Admitting: Student

## 2019-02-28 DIAGNOSIS — Z7982 Long term (current) use of aspirin: Secondary | ICD-10-CM | POA: Diagnosis not present

## 2019-02-28 DIAGNOSIS — Z8673 Personal history of transient ischemic attack (TIA), and cerebral infarction without residual deficits: Secondary | ICD-10-CM | POA: Insufficient documentation

## 2019-02-28 DIAGNOSIS — I10 Essential (primary) hypertension: Secondary | ICD-10-CM | POA: Diagnosis not present

## 2019-02-28 DIAGNOSIS — I2510 Atherosclerotic heart disease of native coronary artery without angina pectoris: Secondary | ICD-10-CM | POA: Diagnosis not present

## 2019-02-28 DIAGNOSIS — R4182 Altered mental status, unspecified: Secondary | ICD-10-CM

## 2019-02-28 DIAGNOSIS — Z79899 Other long term (current) drug therapy: Secondary | ICD-10-CM | POA: Diagnosis not present

## 2019-02-28 DIAGNOSIS — F0390 Unspecified dementia without behavioral disturbance: Secondary | ICD-10-CM | POA: Diagnosis not present

## 2019-02-28 DIAGNOSIS — Z91040 Latex allergy status: Secondary | ICD-10-CM | POA: Diagnosis not present

## 2019-02-28 DIAGNOSIS — T50905A Adverse effect of unspecified drugs, medicaments and biological substances, initial encounter: Secondary | ICD-10-CM

## 2019-02-28 LAB — CBC WITH DIFFERENTIAL/PLATELET
Abs Immature Granulocytes: 0.03 10*3/uL (ref 0.00–0.07)
Basophils Absolute: 0.1 10*3/uL (ref 0.0–0.1)
Basophils Relative: 1 %
Eosinophils Absolute: 0.4 10*3/uL (ref 0.0–0.5)
Eosinophils Relative: 5 %
HCT: 42.7 % (ref 36.0–46.0)
Hemoglobin: 14.4 g/dL (ref 12.0–15.0)
Immature Granulocytes: 0 %
Lymphocytes Relative: 44 %
Lymphs Abs: 3.7 10*3/uL (ref 0.7–4.0)
MCH: 34.3 pg — ABNORMAL HIGH (ref 26.0–34.0)
MCHC: 33.7 g/dL (ref 30.0–36.0)
MCV: 101.7 fL — ABNORMAL HIGH (ref 80.0–100.0)
Monocytes Absolute: 1 10*3/uL (ref 0.1–1.0)
Monocytes Relative: 12 %
Neutro Abs: 3.1 10*3/uL (ref 1.7–7.7)
Neutrophils Relative %: 38 %
Platelets: 199 10*3/uL (ref 150–400)
RBC: 4.2 MIL/uL (ref 3.87–5.11)
RDW: 13.5 % (ref 11.5–15.5)
WBC: 8.3 10*3/uL (ref 4.0–10.5)
nRBC: 0 % (ref 0.0–0.2)

## 2019-02-28 LAB — URINALYSIS, COMPLETE (UACMP) WITH MICROSCOPIC
Bilirubin Urine: NEGATIVE
Glucose, UA: NEGATIVE mg/dL
Hgb urine dipstick: NEGATIVE
Ketones, ur: NEGATIVE mg/dL
Leukocytes,Ua: NEGATIVE
Nitrite: NEGATIVE
Protein, ur: NEGATIVE mg/dL
Specific Gravity, Urine: 1.011 (ref 1.005–1.030)
Squamous Epithelial / HPF: NONE SEEN (ref 0–5)
pH: 6 (ref 5.0–8.0)

## 2019-02-28 LAB — COMPREHENSIVE METABOLIC PANEL
ALT: 36 U/L (ref 0–44)
AST: 28 U/L (ref 15–41)
Albumin: 4.3 g/dL (ref 3.5–5.0)
Alkaline Phosphatase: 68 U/L (ref 38–126)
Anion gap: 10 (ref 5–15)
BUN: 26 mg/dL — ABNORMAL HIGH (ref 8–23)
CO2: 26 mmol/L (ref 22–32)
Calcium: 9.4 mg/dL (ref 8.9–10.3)
Chloride: 104 mmol/L (ref 98–111)
Creatinine, Ser: 0.79 mg/dL (ref 0.44–1.00)
GFR calc Af Amer: >60 mL/min (ref >60)
GFR calc non Af Amer: >60 mL/min (ref >60)
Glucose, Bld: 117 mg/dL — ABNORMAL HIGH (ref 70–99)
Potassium: 3.8 mmol/L (ref 3.5–5.1)
Sodium: 140 mmol/L (ref 135–145)
Total Bilirubin: 0.6 mg/dL (ref 0.3–1.2)
Total Protein: 7.5 g/dL (ref 6.5–8.1)

## 2019-02-28 LAB — LIPASE, BLOOD: Lipase: 63 U/L — ABNORMAL HIGH (ref 11–51)

## 2019-02-28 LAB — CK: Total CK: 70 U/L (ref 38–234)

## 2019-02-28 NOTE — Discharge Instructions (Signed)
Thank you for letting us take care of you in the emergency department today.   Please STOP taking the Seroquel.   Please follow up with: - Your primary care doctor to review your ER visit and follow up on your symptoms.   Please return to the ER for any new or worsening symptoms.

## 2019-02-28 NOTE — ED Notes (Signed)
Diaper changed. Paper pants and socks given to wear home.

## 2019-02-28 NOTE — ED Provider Notes (Signed)
[facility] Emergency Department Provider Note  ____________________________________________   First MD Initiated Contact with Patient 02/28/19 [phone]     (approximate)  I have reviewed the triage vital signs and the nursing notes.  History  Chief Complaint No chief complaint on file.    HPI Yolanda Casey is a 85 y.o. female with history of CAD, dementia (with particular worsening over the last several weeks) who presents via EMS for AMS above baseline.  Patient lives at home with family, and was found on the ground in her room this morning laying down.  She seemed somewhat disheveled and was not interacting appropriately, family called EMS.  Family reports she was recently started on Seroquel at night, had her second dose last night.  EMS gave 50 mg of IV Benadryl with improvement in her interaction status, though family states she is still not completely at her baseline.  The patient herself is able to tell me her name, she has no complaints except for dry lips. Daughter is unsure of how long the patient may have been on the ground.          Past Medical Hx Past Medical History:  Diagnosis Date  . Coronary artery disease   . Dementia (HCC)   . High cholesterol   . Hypertension   . Stroke [facility](HCC)     Problem List Patient Active Problem List   Diagnosis Date Noted  . Lumbar degenerative disc disease 12/29/2016  . Spinal stenosis 12/29/2016  . Pyuria, sterile 12/29/2016  . Hyperglycemia 12/29/2016  . Generalized weakness 12/29/2016  . Back pain 12/28/2016    Past Surgical Hx Past Surgical History:  Procedure Laterality Date  . ABDOMINAL HYSTERECTOMY    . CARDIAC SURGERY    . none      Medications Prior to Admission medications   Medication Sig Start Date End Date Taking? Authorizing Provider  acetaminophen (TYLENOL) 500 MG tablet Take 1-2 tablets by mouth every 6 (six) hours as needed. 11/27/08   [provider]  aspirin EC 81 MG  tablet Take 1 tablet by mouth daily. 11/27/08   [provider]  cephALEXin (KEFLEX) 500 MG capsule Take 1 capsule (500 mg total) by mouth 3 (three) times daily. 12/07/18   Phineas SemenGoodman, Graydon, MD  Multiple Vitamins-Minerals (CENTRUM SILVER) CHEW Chew 1 tablet by mouth 2 (two) times daily.    [provider]  polyethylene glycol (MIRALAX / GLYCOLAX) packet Take 17 g by mouth daily. 12/30/16   [name], [name], MD  psyllium (REGULOID) 0.52 g capsule Take 0.52 g by mouth daily.    [provider]  ramipril (ALTACE) 5 MG capsule Take 1 capsule by mouth daily. 12/16/16   [provider]  rosuvastatin (CRESTOR) 40 MG tablet Take 1 tablet by mouth daily. 10/23/16   [provider]  senna-docusate (SENOKOT-S) 8.6-50 MG tablet Take 1 tablet by mouth at bedtime as needed for mild constipation. 12/29/16   [name], [name], MD  TOPROL XL 25 MG 24 hr tablet Take 0.5 tablets by mouth daily. 11/10/16   [provider]    Allergies Omeprazole, Atenolol, Levofloxacin, Adhesive [tape], Albuterol, Atorvastatin, Ciprofloxacin, Codeine, Diclofenac, Fluoxetine, Indomethacin, Latex, Losartan, Oxybutynin chloride, Oxycodone-acetaminophen, Sucralfate, Tramadol, and Gabapentin  Family Hx Family History  Problem Relation Age of Onset  . Renal Disease Mother   . Heart failure Father     Social Hx Social History   Tobacco Use  . Smoking status: Never Smoker  . Smokeless tobacco: Never  Used  Substance Use Topics  . Alcohol use: No  . Drug use: No     Review of Systems  Constitutional: Negative for fever. Negative for chills. Eyes: Negative for visual changes. ENT: Negative for sore throat. Cardiovascular: Negative for chest pain. Respiratory: Negative for shortness of breath. Gastrointestinal: Negative for abdominal pain. Negative for nausea. Negative for vomiting. Genitourinary: Negative for dysuria. Musculoskeletal: Negative for leg swelling. Skin: Negative for  rash. Neurological: Negative for for headaches.  Note: limited due to patient's dementia   Physical Exam  Vital Signs: ED Triage Vitals  Enc Vitals Group     BP 02/28/19 0916 (!) 151/88     Pulse Rate 02/28/19 0916 90     Resp --      Temp 02/28/19 0916 97.6 F (36.4 C)     Temp Source 02/28/19 0916 Oral     SpO2 02/28/19 0916 98 %     Weight 02/28/19 0917 140 lb (63.5 kg)     Height 02/28/19 0917 5\' 1"  (1.549 m)     Head Circumference --      Peak Flow --      Pain Score --      Pain Loc --      Pain Edu? --      Excl. in [place]? --     Constitutional: Awake, able to state her name. Follows commands.  Eyes: Conjunctivae clear. Sclera anicteric. Head: Normocephalic. Atraumatic. Nose: No congestion. No rhinorrhea. Mouth/Throat: Mucous membranes are dry.  Neck: No stridor.   Cardiovascular: Normal rate, regular rhythm. No murmurs. Extremities well perfused. Respiratory: Normal respiratory effort.  Lungs CTAB. Gastrointestinal: Soft and non-tender. Ostomy in place, well appearing. No distention.  Musculoskeletal: No lower extremity edema. Extremities appear atraumatic. FROM to BUE and BLE. Neurologic:  Awake, states name, speech is clear. No gross focal neurologic deficits are appreciated.  Skin: Skin is warm, dry and intact. No ecchymosis, lacerations.  Psychiatric: Mood and affect are appropriate for situation.  EKG  Personally reviewed.   Rate: 89 Rhythm: sinus Axis: leftward Intervals: within normal limits Frequent PVCs No STEMI    Radiology  CT: IMPRESSION: Atrophy with a questionable degree of concomitant communicating hydrocephalus, stable. No mass or hemorrhage. Decreased attenuation in the centra semiovale bilaterally likely represents small vessel disease, particularly with a degree of superimposed interstitial edema. No acute infarct evident.  There are multiple foci of arterial vascular calcification. There is mucosal thickening in several  ethmoid air cells. There is probable cerumen in the external auditory canals.     Procedures  Procedure(s) performed (including critical care):  Procedures   Initial Impression / Assessment and Plan / ED Course  85 y.o. female who presents to the ED after being found on the ground this AM by family, more confused than baseline. As above.   On exam she can state her name, follow commands, though family says she is normally more interactive than this.  Ddx: worsening dementia, infection, electrolyte abnormality, medication reaction to Seroquel  Plan: labs, urine, CT head, reassess. Received 50 mg Benadryl already with EMS.  Exam appears atraumatic, no indication for extremity imaging at this time.  Work-up without actionable derangements.  Electrolytes within normal limits, no evidence of urine infection.  CT head negative.  On reassessment, patient is even more interactive, answering questions and is in engaging in conversation with full sentences.  Daughter at bedside states she has fully returned to her (dementia) baseline.  Suspect this was likely a drug  reaction related to the Seroquel, with improvement after the IV Benadryl.  Advise discontinuation of the Seroquel, and follow-up with primary care provider.  Daughter at bedside voices understanding and is comfortable to plan and discharge.  Final Clinical Impression(s) / ED Diagnosis  Final diagnoses:  Altered mental status, unspecified altered mental status type     Note:  This document was prepared using Dragon voice recognition software and may include unintentional dictation errors.   Miguel Aschoff., MD 02/28/19 [phone]

## 2019-02-28 NOTE — ED Notes (Signed)
In and out cath done by [facility] RN, sterile technique maintained.  This RN at bedside.

## 2019-02-28 NOTE — ED Triage Notes (Signed)
Patient presents with possible dystonic reaction, found by family laying on ground.  EMS administered benadryl with some improvement.  CBG 124.  22 gauge in right AC.  Dementia worse over past 3 weeks.  Just started taking 25 mg seroquel (2 doses) for sleep.  Family states patient's LOC is not at baseline, patient is incontinent, stoma, and speech was normal yesterday.

## 2019-03-01 LAB — URINE CULTURE: Culture: >=100000 — AB

## 2019-03-02 NOTE — Progress Notes (Signed)
ED Antimicrobial Stewardship Positive Culture Follow Up   FLORNCE RECORD is an 85 y.o. female who presented to [facility] on 02/28/2019 with a chief complaint of No chief complaint on file. Found on ground, AMS  Recent Results (from the past 720 hour(s))  SARS Coronavirus 2 [facility] order, Performed in [facility] hospital lab) Nasopharyngeal Nasopharyngeal Swab     Status: None   Collection Time: 02/10/19  7:15 PM   Specimen: Nasopharyngeal Swab  Result Value Ref Range Status   SARS Coronavirus 2 NEGATIVE NEGATIVE Final    Comment: (NOTE) If result is NEGATIVE SARS-CoV-2 target nucleic acids are NOT DETECTED. The SARS-CoV-2 RNA is generally detectable in upper and lower  respiratory specimens during the acute phase of infection. The lowest  concentration of SARS-CoV-2 viral copies this assay can detect is 250  copies / mL. A negative result does not preclude SARS-CoV-2 infection  and should not be used as the sole basis for treatment or other  patient management decisions.  A negative result may occur with  improper specimen collection / handling, submission of specimen other  than nasopharyngeal swab, presence of viral mutation(s) within the  areas targeted by this assay, and inadequate number of viral copies  (<250 copies / mL). A negative result must be combined with clinical  observations, patient history, and epidemiological information. If result is POSITIVE SARS-CoV-2 target nucleic acids are DETECTED. The SARS-CoV-2 RNA is generally detectable in upper and lower  respiratory specimens dur ing the acute phase of infection.  Positive  results are indicative of active infection with SARS-CoV-2.  Clinical  correlation with patient history and other diagnostic information is  necessary to determine patient infection status.  Positive results do  not rule out bacterial infection or co-infection with other viruses. If result is PRESUMPTIVE POSTIVE SARS-CoV-2 nucleic acids MAY  BE PRESENT.   A presumptive positive result was obtained on the submitted specimen  and confirmed on repeat testing.  While 2019 novel coronavirus  (SARS-CoV-2) nucleic acids may be present in the submitted sample  additional confirmatory testing may be necessary for epidemiological  and / or clinical management purposes  to differentiate between  SARS-CoV-2 and other Sarbecovirus currently known to infect humans.  If clinically indicated additional testing with an alternate test  methodology [phone]) is advised. The SARS-CoV-2 RNA is generally  detectable in upper and lower respiratory sp ecimens during the acute  phase of infection. The expected result is Negative. Fact Sheet for Patients:  StrictlyIdeas.no Fact Sheet for Healthcare Providers: BankingDealers.co.za This test is not yet approved or cleared by the [place] FDA and has been authorized for detection and/or diagnosis of SARS-CoV-2 by FDA under an Emergency Use Authorization (EUA).  This EUA will remain in effect (meaning this test can be used) for the duration of the COVID-19 declaration under Section 564(b)(1) of the Act, 21 U.S.C. section 360bbb-3(b)(1), unless the authorization is terminated or revoked sooner. Performed at [facility], [address] [place], [place] [address]   Urine culture     Status: Abnormal   Collection Time: 02/28/19  9:24 AM   Specimen: Urine, Random  Result Value Ref Range Status   Specimen Description   Final    URINE, RANDOM Performed at [facility], [address], [place], [place] [address]    Special Requests   Final    NONE Performed at [facility], [address], [place], [place] [address]    Culture (A)  Final    >=100,000 COLONIES/mL GROUP B STREP(S.AGALACTIAE)ISOLATED TESTING AGAINST S. AGALACTIAE NOT ROUTINELY PERFORMED DUE TO PREDICTABILITY OF AMP/PEN/VAN SUSCEPTIBILITY. Performed at  [facility], [address] [place], [place] [address]    Report Status 03/01/2019 FINAL  Final    [x]  Patient discharged originally without antimicrobial agent and treatment is now indicated. MD would like to treat potential UTI  New antibiotic prescription: Amoxicillin 500mg  po TID x 5 days  ED Provider: Paschal DoppSarah Monks  8/29 Called patient's phone number and spoke with daughter Dois DavenportSandra [phone])-pt has dementia. The preferred Pharmacy is [facility] in [place] [phone]. Called in prescription for above antibiotic.   Kyo Cocuzza A 03/02/2019, 4:41 PM Clinical Pharmacist

## 2019-03-27 ENCOUNTER — Emergency Department: Payer: Medicare Other

## 2019-03-27 ENCOUNTER — Emergency Department
Admission: EM | Admit: 2019-03-27 | Discharge: 2019-03-27 | Disposition: A | Discharge status: Home / Self Care | Payer: Medicare Other | Attending: Emergency Medicine | Admitting: Emergency Medicine

## 2019-03-27 ENCOUNTER — Other Ambulatory Visit: Payer: Self-pay

## 2019-03-27 ENCOUNTER — Encounter: Payer: Self-pay | Admitting: Emergency Medicine

## 2019-03-27 DIAGNOSIS — F0390 Unspecified dementia without behavioral disturbance: Secondary | ICD-10-CM | POA: Diagnosis not present

## 2019-03-27 DIAGNOSIS — Z8673 Personal history of transient ischemic attack (TIA), and cerebral infarction without residual deficits: Secondary | ICD-10-CM | POA: Insufficient documentation

## 2019-03-27 DIAGNOSIS — I10 Essential (primary) hypertension: Secondary | ICD-10-CM | POA: Diagnosis not present

## 2019-03-27 DIAGNOSIS — Z933 Colostomy status: Secondary | ICD-10-CM | POA: Insufficient documentation

## 2019-03-27 DIAGNOSIS — I2510 Atherosclerotic heart disease of native coronary artery without angina pectoris: Secondary | ICD-10-CM | POA: Diagnosis not present

## 2019-03-27 DIAGNOSIS — Z79899 Other long term (current) drug therapy: Secondary | ICD-10-CM | POA: Diagnosis not present

## 2019-03-27 DIAGNOSIS — Z7982 Long term (current) use of aspirin: Secondary | ICD-10-CM | POA: Diagnosis not present

## 2019-03-27 DIAGNOSIS — R079 Chest pain, unspecified: Secondary | ICD-10-CM

## 2019-03-27 DIAGNOSIS — Z91040 Latex allergy status: Secondary | ICD-10-CM | POA: Insufficient documentation

## 2019-03-27 DIAGNOSIS — R0789 Other chest pain: Secondary | ICD-10-CM | POA: Insufficient documentation

## 2019-03-27 LAB — BASIC METABOLIC PANEL
Anion gap: 11 (ref 5–15)
BUN: 27 mg/dL — ABNORMAL HIGH (ref 8–23)
CO2: 26 mmol/L (ref 22–32)
Calcium: 9.7 mg/dL (ref 8.9–10.3)
Chloride: 104 mmol/L (ref 98–111)
Creatinine, Ser: 0.81 mg/dL (ref 0.44–1.00)
GFR calc Af Amer: >60 mL/min (ref >60)
GFR calc non Af Amer: >60 mL/min (ref >60)
Glucose, Bld: 115 mg/dL — ABNORMAL HIGH (ref 70–99)
Potassium: 3.9 mmol/L (ref 3.5–5.1)
Sodium: 141 mmol/L (ref 135–145)

## 2019-03-27 LAB — TROPONIN I (HIGH SENSITIVITY)
Troponin I (High Sensitivity): 14 ng/L (ref <18)
Troponin I (High Sensitivity): 13 ng/L (ref <18)

## 2019-03-27 LAB — CBC
HCT: 46.2 % — ABNORMAL HIGH (ref 36.0–46.0)
Hemoglobin: 15.7 g/dL — ABNORMAL HIGH (ref 12.0–15.0)
MCH: 34.4 pg — ABNORMAL HIGH (ref 26.0–34.0)
MCHC: 34 g/dL (ref 30.0–36.0)
MCV: 101.1 fL — ABNORMAL HIGH (ref 80.0–100.0)
Platelets: 204 10*3/uL (ref 150–400)
RBC: 4.57 MIL/uL (ref 3.87–5.11)
RDW: 13.3 % (ref 11.5–15.5)
WBC: 6.8 10*3/uL (ref 4.0–10.5)
nRBC: 0 % (ref 0.0–0.2)

## 2019-03-27 NOTE — ED Notes (Signed)
Pt assisted up to toilet at this time.  

## 2019-03-27 NOTE — ED Notes (Signed)
Dr. Goodman at bedside.  

## 2019-03-27 NOTE — Discharge Instructions (Addendum)
Please seek medical attention for any high fevers, chest pain, shortness of breath, change in behavior, persistent vomiting, bloody stool or any other new or concerning symptoms.  

## 2019-03-27 NOTE — ED Notes (Signed)
Abdomen around colostomy is distended but pt family and pt states that this normal and has been normal for the last few years.

## 2019-03-27 NOTE — ED Provider Notes (Signed)
[facility] Emergency Department Provider Note  ____________________________________________   I have reviewed the triage vital signs and the nursing notes.   HISTORY  Chief Complaint Chest Pain   History limited by: Not Limited   HPI Yolanda Casey is a 85 y.o. female who presents to the emergency department today because of concerns for chest pain.  Patient states that he was already up today when it started.  She says that it was located in the left side of her chest.  She does describe it as a pressure-like sensation.  It was accompanied by some shortness of breath and the patient felt flushed.  She says that she has had multiple heart attacks in the past and this reminded her of her previous heart attacks.  She states she has been taking her medications as prescribed.  She denies any recent illness, fevers, nausea or vomiting. Patient says she no longer has pain at the time of my examination.  Records reviewed. Per medical record review patient has a history of CAD, HLD, HTN.   Past Medical History:  Diagnosis Date  . Coronary artery disease   . Dementia ([place])   . High cholesterol   . Hypertension   . Stroke [facility])     Patient Active Problem List   Diagnosis Date Noted  . Lumbar degenerative disc disease 12/29/2016  . Spinal stenosis 12/29/2016  . Pyuria, sterile 12/29/2016  . Hyperglycemia 12/29/2016  . Generalized weakness 12/29/2016  . Back pain 12/28/2016    Past Surgical History:  Procedure Laterality Date  . ABDOMINAL HYSTERECTOMY    . CARDIAC SURGERY    . none      Prior to Admission medications   Medication Sig Start Date End Date Taking? Authorizing Provider  acetaminophen (TYLENOL) 500 MG tablet Take 1-2 tablets by mouth every 6 (six) hours as needed. 11/27/08   [provider]  aspirin EC 81 MG tablet Take 1 tablet by mouth daily. 11/27/08   [provider]  cephALEXin (KEFLEX) 500 MG capsule Take 1 capsule (500  mg total) by mouth 3 (three) times daily. 12/07/18   Nance Pear, MD  Multiple Vitamins-Minerals (CENTRUM SILVER) CHEW Chew 1 tablet by mouth 2 (two) times daily.    [provider]  polyethylene glycol (MIRALAX / GLYCOLAX) packet Take 17 g by mouth daily. 12/30/16   Theodoro Grist, MD  psyllium (REGULOID) 0.52 g capsule Take 0.52 g by mouth daily.    [provider]  ramipril (ALTACE) 5 MG capsule Take 1 capsule by mouth daily. 12/16/16   [provider]  rosuvastatin (CRESTOR) 40 MG tablet Take 1 tablet by mouth daily. 10/23/16   [provider]  senna-docusate (SENOKOT-S) 8.6-50 MG tablet Take 1 tablet by mouth at bedtime as needed for mild constipation. 12/29/16   Theodoro Grist, MD  TOPROL XL 25 MG 24 hr tablet Take 0.5 tablets by mouth daily. 11/10/16   [provider]    Allergies Omeprazole, Atenolol, Levofloxacin, Adhesive [tape], Albuterol, Atorvastatin, Ciprofloxacin, Codeine, Diclofenac, Fluoxetine, Indomethacin, Latex, Losartan, Oxybutynin chloride, Oxycodone-acetaminophen, Seroquel [quetiapine fumarate], Sucralfate, Tramadol, and Gabapentin  Family History  Problem Relation Age of Onset  . Renal Disease Mother   . Heart failure Father     Social History Social History   Tobacco Use  . Smoking status: Never Smoker  . Smokeless tobacco: Never Used  Substance Use Topics  . Alcohol use: No  . Drug use: No    Review of Systems Constitutional:  No fever/chills Eyes: No visual changes. ENT: No sore throat. Cardiovascular: Positive for chest pain. Respiratory: Positive for shortness of breath. Gastrointestinal: No abdominal pain.  No nausea, no vomiting.  No diarrhea.   Genitourinary: Negative for dysuria. Musculoskeletal: Negative for back pain. Skin: Negative for rash. Neurological: Negative for headaches, focal weakness or numbness.  ____________________________________________   PHYSICAL EXAM:  VITAL SIGNS: ED Triage  Vitals  Enc Vitals Group     BP 03/27/19 0946 (!) 141/87     Pulse Rate 03/27/19 0946 84     Resp 03/27/19 0946 18     Temp 03/27/19 0946 98.2 F (36.8 C)     Temp Source 03/27/19 0946 Oral     SpO2 03/27/19 0942 96 %     Weight 03/27/19 0947 140 lb (63.5 kg)     Height 03/27/19 0947 5\' 1"  (1.549 m)     Head Circumference --      Peak Flow --      Pain Score 03/27/19 0947 0   Constitutional: Alert and oriented.  Eyes: Conjunctivae are normal.  ENT      Head: Normocephalic and atraumatic.      Nose: No congestion/rhinnorhea.      Mouth/Throat: Mucous membranes are moist.      Neck: No stridor. Hematological/Lymphatic/Immunilogical: No cervical lymphadenopathy. Cardiovascular: Normal rate, regular rhythm.  No murmurs, rubs, or gallops.  Respiratory: Normal respiratory effort without tachypnea nor retractions. Breath sounds are clear and equal bilaterally. No wheezes/rales/rhonchi. Gastrointestinal: Soft and non tender. No rebound. No guarding.  Genitourinary: Deferred Musculoskeletal: Normal range of motion in all extremities. No lower extremity edema. Neurologic:  Normal speech and language. No gross focal neurologic deficits are appreciated.  Skin:  Skin is warm, dry and intact. No rash noted. Psychiatric: Mood and affect are normal. Speech and behavior are normal. Patient exhibits appropriate insight and judgment.  ____________________________________________    LABS (pertinent positives/negatives)  Trop hs 14 ->13 BMP wnl except glu 115, BUN 27 CBC wbc 6.8, hgb 15.7, plt 204  ____________________________________________   EKG  I, 03/29/19, attending physician, personally viewed and interpreted this EKG  EKG Time: 0945 Rate: 85 Rhythm: sinus rhythm Axis: left axis deviation Intervals: qtc 477 QRS: nonspecific IVCD, LVH ST changes: no st elevation Impression: abnormal ekg   ____________________________________________    RADIOLOGY  CXR No acute  abnormality  ____________________________________________   PROCEDURES  Procedures  ____________________________________________   INITIAL IMPRESSION / ASSESSMENT AND PLAN / ED COURSE  Pertinent labs & imaging results that were available during my care of the patient were reviewed by me and considered in my medical decision making (see chart for details).   Patient presented to the emergency department today because of concerns for an episode of chest pain.  She describes it as being located in her left chest.  Patient does have history of coronary artery disease.  ACS was considered and 2 sets of troponin were sent.  No concerning elevation.  Patient had no further episodes of chest pain here in the emergency department.  Patient's daughter mention that she found the patient be unresponsive when the episode first occurred.  Patient does have some dementia and did not relay this information.  Terms of syncopal causes patient is not significantly anemic, troponin was negative no concerning electrolyte abnormality.  Patient had no concerning arrhythmias on the monitor here.  Discussed importance of follow-up with primary care.  ____________________________________________   FINAL CLINICAL IMPRESSION(S) / ED DIAGNOSES  Final diagnoses:  Nonspecific chest pain     Note: This dictation was prepared with Dragon dictation. Any transcriptional errors that result from this process are unintentional     Phineas Semen, MD 03/27/19 1312

## 2019-03-27 NOTE — ED Notes (Signed)
Patient transported to X-ray 

## 2019-03-27 NOTE — ED Triage Notes (Signed)
Pt arrival via EMS from home due to cp that started an hour and a half ago. EMS states that pt family said she's been having seizure-like activity for the past month.   Pt denies current cp, sob, and fevers.

## 2019-03-27 NOTE — ED Notes (Signed)
Pt brief changed. Peri area cleaned with wipes. New brief applied.

## 2019-03-27 NOTE — ED Notes (Signed)
Pt assisted to bedside toilet at this time

## 2019-04-19 ENCOUNTER — Other Ambulatory Visit: Payer: Self-pay

## 2019-04-19 ENCOUNTER — Emergency Department: Payer: Medicare Other

## 2019-04-19 ENCOUNTER — Observation Stay
Admission: EM | Admit: 2019-04-19 | Discharge: 2019-04-20 | Disposition: A | Discharge status: Home / Self Care | Payer: Medicare Other | Attending: Internal Medicine | Admitting: Internal Medicine

## 2019-04-19 DIAGNOSIS — Z20828 Contact with and (suspected) exposure to other viral communicable diseases: Secondary | ICD-10-CM | POA: Insufficient documentation

## 2019-04-19 DIAGNOSIS — I119 Hypertensive heart disease without heart failure: Secondary | ICD-10-CM | POA: Diagnosis not present

## 2019-04-19 DIAGNOSIS — Z79899 Other long term (current) drug therapy: Secondary | ICD-10-CM | POA: Insufficient documentation

## 2019-04-19 DIAGNOSIS — I10 Essential (primary) hypertension: Secondary | ICD-10-CM | POA: Diagnosis present

## 2019-04-19 DIAGNOSIS — F0390 Unspecified dementia without behavioral disturbance: Secondary | ICD-10-CM | POA: Insufficient documentation

## 2019-04-19 DIAGNOSIS — R569 Unspecified convulsions: Secondary | ICD-10-CM | POA: Diagnosis present

## 2019-04-19 DIAGNOSIS — I2510 Atherosclerotic heart disease of native coronary artery without angina pectoris: Secondary | ICD-10-CM | POA: Diagnosis not present

## 2019-04-19 DIAGNOSIS — Z8673 Personal history of transient ischemic attack (TIA), and cerebral infarction without residual deficits: Secondary | ICD-10-CM | POA: Diagnosis not present

## 2019-04-19 DIAGNOSIS — Z91040 Latex allergy status: Secondary | ICD-10-CM | POA: Diagnosis not present

## 2019-04-19 DIAGNOSIS — Z7982 Long term (current) use of aspirin: Secondary | ICD-10-CM | POA: Insufficient documentation

## 2019-04-19 DIAGNOSIS — Z881 Allergy status to other antibiotic agents status: Secondary | ICD-10-CM | POA: Diagnosis not present

## 2019-04-19 DIAGNOSIS — E7800 Pure hypercholesterolemia, unspecified: Secondary | ICD-10-CM | POA: Diagnosis not present

## 2019-04-19 DIAGNOSIS — Z880 Allergy status to penicillin: Secondary | ICD-10-CM | POA: Diagnosis not present

## 2019-04-19 DIAGNOSIS — E785 Hyperlipidemia, unspecified: Secondary | ICD-10-CM | POA: Diagnosis present

## 2019-04-19 DIAGNOSIS — Z66 Do not resuscitate: Secondary | ICD-10-CM | POA: Diagnosis not present

## 2019-04-19 DIAGNOSIS — Z8249 Family history of ischemic heart disease and other diseases of the circulatory system: Secondary | ICD-10-CM | POA: Insufficient documentation

## 2019-04-19 DIAGNOSIS — Z888 Allergy status to other drugs, medicaments and biological substances status: Secondary | ICD-10-CM | POA: Diagnosis not present

## 2019-04-19 DIAGNOSIS — Z885 Allergy status to narcotic agent status: Secondary | ICD-10-CM | POA: Insufficient documentation

## 2019-04-19 LAB — CBC WITH DIFFERENTIAL/PLATELET
Abs Immature Granulocytes: 0.02 10*3/uL (ref 0.00–0.07)
Basophils Absolute: 0 10*3/uL (ref 0.0–0.1)
Basophils Relative: 0 %
Eosinophils Absolute: 0.2 10*3/uL (ref 0.0–0.5)
Eosinophils Relative: 2 %
HCT: 41.2 % (ref 36.0–46.0)
Hemoglobin: 14 g/dL (ref 12.0–15.0)
Immature Granulocytes: 0 %
Lymphocytes Relative: 47 %
Lymphs Abs: 3.6 10*3/uL (ref 0.7–4.0)
MCH: 34.4 pg — ABNORMAL HIGH (ref 26.0–34.0)
MCHC: 34 g/dL (ref 30.0–36.0)
MCV: 101.2 fL — ABNORMAL HIGH (ref 80.0–100.0)
Monocytes Absolute: 0.9 10*3/uL (ref 0.1–1.0)
Monocytes Relative: 12 %
Neutro Abs: 3 10*3/uL (ref 1.7–7.7)
Neutrophils Relative %: 39 %
Platelets: 198 10*3/uL (ref 150–400)
RBC: 4.07 MIL/uL (ref 3.87–5.11)
RDW: 13.3 % (ref 11.5–15.5)
WBC: 7.7 10*3/uL (ref 4.0–10.5)
nRBC: 0 % (ref 0.0–0.2)

## 2019-04-19 LAB — URINALYSIS, COMPLETE (UACMP) WITH MICROSCOPIC
Bacteria, UA: NONE SEEN
Bilirubin Urine: NEGATIVE
Glucose, UA: NEGATIVE mg/dL
Hgb urine dipstick: NEGATIVE
Ketones, ur: NEGATIVE mg/dL
Leukocytes,Ua: NEGATIVE
Nitrite: NEGATIVE
Protein, ur: NEGATIVE mg/dL
Specific Gravity, Urine: 1.013 (ref 1.005–1.030)
pH: 6 (ref 5.0–8.0)

## 2019-04-19 LAB — BASIC METABOLIC PANEL
Anion gap: 8 (ref 5–15)
BUN: 27 mg/dL — ABNORMAL HIGH (ref 8–23)
CO2: 24 mmol/L (ref 22–32)
Calcium: 9.5 mg/dL (ref 8.9–10.3)
Chloride: 108 mmol/L (ref 98–111)
Creatinine, Ser: 1 mg/dL (ref 0.44–1.00)
GFR calc Af Amer: 57 mL/min — ABNORMAL LOW (ref >60)
GFR calc non Af Amer: 49 mL/min — ABNORMAL LOW (ref >60)
Glucose, Bld: 98 mg/dL (ref 70–99)
Potassium: 4.4 mmol/L (ref 3.5–5.1)
Sodium: 140 mmol/L (ref 135–145)

## 2019-04-19 LAB — CK: Total CK: 60 U/L (ref 38–234)

## 2019-04-19 MED ORDER — IOHEXOL 350 MG/ML SOLN
75.0000 mL | Freq: Once | INTRAVENOUS | Status: AC | PRN
  Administered 2019-04-19: 75 mL via INTRAVENOUS

## 2019-04-19 MED ORDER — LEVETIRACETAM IN NACL 1000 MG/100ML IV SOLN
1000.0000 mg | Freq: Once | INTRAVENOUS | Status: AC
  Administered 2019-04-19: 1000 mg via INTRAVENOUS
  Filled 2019-04-19: qty 100

## 2019-04-19 NOTE — ED Notes (Signed)
Patient transported to CT 

## 2019-04-19 NOTE — H&P (Signed)
[facility] [place] NAME: Yolanda Casey    MR#:  [id number]  DATE OF BIRTH:  1926-01-01  DATE OF ADMISSION:  04/19/2019  PRIMARY CARE PHYSICIAN: Hortencia Pilar, MD   REQUESTING/REFERRING PHYSICIAN: Ellender Hose, MD  CHIEF COMPLAINT:   Chief Complaint  Patient presents with  . Seizures    HISTORY OF PRESENT ILLNESS:  Yolanda Casey  is a 85 y.o. female who presents with chief complaint as above.  Patient brought to the ED for complaint of possible seizure activity.  She has been seen in this ED multiple times over the last several weeks for these episodes.  Initially it was thought that perhaps she had some cardiac abnormality.  After thorough work-up this was found not to be the case.  Her episodes sound concerning for possible seizure, with what her daughter describes as somewhat prolonged postictal states.  Hospitalist called for admission and further evaluation.  FYI, patient has dementia and is unable to contribute much significant information to her HPI.  History was given by her daughter who was at bedside.  PAST MEDICAL HISTORY:   Past Medical History:  Diagnosis Date  . Coronary artery disease   . Dementia ([place])   . High cholesterol   . Hypertension   . Stroke [facility])      PAST SURGICAL HISTORY:   Past Surgical History:  Procedure Laterality Date  . ABDOMINAL HYSTERECTOMY    . CARDIAC SURGERY    . none       SOCIAL HISTORY:   Social History   Tobacco Use  . Smoking status: Never Smoker  . Smokeless tobacco: Never Used  Substance Use Topics  . Alcohol use: No     FAMILY HISTORY:   Family History  Problem Relation Age of Onset  . Renal Disease Mother   . Heart failure Father      DRUG ALLERGIES:   Allergies  Allergen Reactions  . Omeprazole Other (See Comments)    Other Reaction: gi upset  . Penicillins Itching    Patient was adamant that he has a "strong allergy to penicillins"  . Atenolol Other  (See Comments)    Other Reaction: DROP IN BP  . Levofloxacin Diarrhea  . Adhesive [Tape]   . Albuterol Other (See Comments)  . Atorvastatin Other (See Comments)  . Ciprofloxacin Other (See Comments)  . Codeine   . Diclofenac Other (See Comments)  . Fluoxetine Other (See Comments)  . Indomethacin Other (See Comments)  . Latex   . Losartan Other (See Comments)  . Oxybutynin Chloride Other (See Comments)  . Oxycodone-Acetaminophen Other (See Comments)  . Seroquel [Quetiapine Fumarate] Other (See Comments)    Dystonic reaction  . Sucralfate Other (See Comments)  . Tramadol Diarrhea    Pt and daughter report it gives her diarrhea. Usually probiotics are given to her to help per daughter.   . Gabapentin Nausea Only    MEDICATIONS AT HOME:   Prior to Admission medications   Medication Sig Start Date End Date Taking? Authorizing Provider  ramipril (ALTACE) 5 MG capsule Take 1 capsule by mouth daily. 12/16/16  Yes [provider]  TOPROL XL 25 MG 24 hr tablet Take 0.5 tablets by mouth daily. 11/10/16  Yes [provider]  acetaminophen (TYLENOL) 500 MG tablet Take 1-2 tablets by mouth every 6 (six) hours as needed. 11/27/08   [provider]  aspirin EC 81 MG tablet Take 1 tablet by mouth daily. 11/27/08  [provider]  Multiple Vitamins-Minerals (CENTRUM SILVER) CHEW Chew 1 tablet by mouth 2 (two) times daily.    [provider]  polyethylene glycol (MIRALAX / GLYCOLAX) packet Take 17 g by mouth daily. 12/30/16   Katharina Caper, MD  psyllium (REGULOID) 0.52 g capsule Take 0.52 g by mouth daily.    [provider]  rosuvastatin (CRESTOR) 40 MG tablet Take 1 tablet by mouth daily. 10/23/16   [provider]  senna-docusate (SENOKOT-S) 8.6-50 MG tablet Take 1 tablet by mouth at bedtime as needed for mild constipation. 12/29/16   Katharina Caper, MD    REVIEW OF SYSTEMS:  Review of Systems  Unable to perform ROS: Dementia      VITAL SIGNS:   Vitals:   04/19/19 1930 04/19/19 2000 04/19/19 2030 04/19/19 2100  BP: (!) 162/70 (!) 162/72 139/61 133/78  Pulse: 76 70 73 88  Resp: 10 13  12   Temp:      TempSrc:      SpO2: 99% 96% 97% 98%  Weight:      Height:       Wt Readings from Last 3 Encounters:  04/19/19 63.5 kg  03/27/19 63.5 kg  02/28/19 63.5 kg    PHYSICAL EXAMINATION:  Physical Exam  Vitals reviewed. Constitutional: She appears well-developed and well-nourished. No distress.  HENT:  Head: Normocephalic and atraumatic.  Mouth/Throat: Oropharynx is clear and moist.  Eyes: Pupils are equal, round, and reactive to light. Conjunctivae and EOM are normal. No scleral icterus.  Neck: Normal range of motion. Neck supple. No JVD present. No thyromegaly present.  Cardiovascular: Normal rate, regular rhythm and intact distal pulses. Exam reveals no gallop and no friction rub.  No murmur heard. Respiratory: Effort normal and breath sounds normal. No respiratory distress. She has no wheezes. She has no rales.  GI: Soft. Bowel sounds are normal. She exhibits no distension. There is no abdominal tenderness.  Musculoskeletal: Normal range of motion.        General: No edema.     Comments: No arthritis, no gout  Lymphadenopathy:    She has no cervical adenopathy.  Neurological: She is alert. No cranial nerve deficit.  No dysarthria, no aphasia  Skin: Skin is warm and dry. No rash noted. No erythema.  Psychiatric:  Unable to fully assess due to dementia    LABORATORY PANEL:   CBC Recent Labs  Lab 04/19/19 1838  WBC 7.7  HGB 14.0  HCT 41.2  PLT 198   ------------------------------------------------------------------------------------------------------------------  Chemistries  Recent Labs  Lab 04/19/19 1838  NA 140  K 4.4  CL 108  CO2 24  GLUCOSE 98  BUN 27*  CREATININE 1.00  CALCIUM 9.5    ------------------------------------------------------------------------------------------------------------------  Cardiac Enzymes No results for input(s): TROPONINI in the last 168 hours. ------------------------------------------------------------------------------------------------------------------  RADIOLOGY:  Ct Angio Head W Or Wo Contrast  Result Date: 04/19/2019 CLINICAL DATA:  Seizure-like activity. Mental status changes. Dementia. EXAM: CT ANGIOGRAPHY HEAD AND NECK TECHNIQUE: Multidetector CT imaging of the head and neck was performed using the standard protocol during bolus administration of intravenous contrast. Multiplanar CT image reconstructions and MIPs were obtained to evaluate the vascular anatomy. Carotid stenosis measurements (when applicable) are obtained utilizing NASCET criteria, using the distal internal carotid diameter as the denominator. CONTRAST:  51mL OMNIPAQUE IOHEXOL 350 MG/ML SOLN COMPARISON:  Head CT same day FINDINGS: CT HEAD FINDINGS Brain: Generalized atrophy as seen previously. Chronic small-vessel ischemic changes of the white matter. Ventriculomegaly due to central atrophy.  No sign of acute infarction, mass lesion, hemorrhage, hydrocephalus or extra-axial collection. Vascular: There is atherosclerotic calcification of the major vessels at the base of the brain. Skull: Negative Sinuses: Clear Orbits: Normal CTA NECK FINDINGS Aortic arch: Aortic atherosclerosis without aneurysm or dissection. Branching pattern is normal without origin stenosis. Right carotid system: Common carotid artery is widely patent to the bifurcation region. Calcified plaque at the carotid bifurcation and ICA bulb. Minimal diameter is 4 mm. Compared to a more distal cervical ICA diameter of 4 mm, there is no stenosis. Left carotid system: Common carotid artery widely patent to the bifurcation. Calcified plaque at the carotid bifurcation and ICA bulb. Minimal diameter in the bulb is 4 mm.  Compared to a more distal cervical ICA diameter of 4 mm, there is no stenosis. Vertebral arteries: Both vertebral artery origins show adjacent calcified plaque but there is no stenosis. Both vertebral arteries are patent through the cervical region to the foramen magnum. Skeleton: Ordinary cervical spondylosis. Other neck: No mass or lymphadenopathy. Upper chest: No active disease. Review of the MIP images confirms the above findings CTA HEAD FINDINGS Anterior circulation: Both internal carotid arteries are patent through the skull base and siphon regions. There is siphon atherosclerotic calcification but no stenosis greater than 30%. The anterior and middle cerebral vessels are patent without proximal stenosis, aneurysm or vascular malformation. No large or medium vessel occlusion is identified. Posterior circulation: Both vertebral arteries are patent through the foramen magnum to the basilar. There is some calcification in the V4 segments but without flow limiting stenosis. No basilar stenosis. Posterior circulation branch vessels are patent. Venous sinuses: Patent and normal. Anatomic variants: None significant. Review of the MIP images confirms the above findings IMPRESSION: 1. No significant finding by CT. Atrophy and chronic small-vessel ischemic changes. 2. No intracranial large or medium vessel occlusion or correctable proximal stenosis. 3. Atherosclerotic disease at both carotid bifurcations but without measurable stenosis. Electronically Signed   By: Paulina Fusi M.D.   On: 04/19/2019 22:23   Ct Head Wo Contrast  Result Date: 04/19/2019 CLINICAL DATA:  85 year old female with altered mental status. EXAM: CT HEAD WITHOUT CONTRAST TECHNIQUE: Contiguous axial images were obtained from the base of the skull through the vertex without intravenous contrast. COMPARISON:  Head CT dated 02/28/2019 FINDINGS: Brain: There is moderate age-related atrophy and chronic microvascular ischemic changes. Stable  dilatation of the lateral and third ventricles similar to prior CT. There is no acute intracranial hemorrhage. No mass effect or midline shift. No extra-axial fluid collection. Vascular: No hyperdense vessel or unexpected calcification. Skull: Normal. Negative for fracture or focal lesion. Sinuses/Orbits: No acute finding. Other: None IMPRESSION: 1. No acute intracranial hemorrhage. 2. Stable atrophy and ventricular dilatation similar to prior CT. Electronically Signed   By: Elgie Collard M.D.   On: 04/19/2019 19:37   Ct Angio Neck W Or Wo Contrast  Result Date: 04/19/2019 CLINICAL DATA:  Seizure-like activity. Mental status changes. Dementia. EXAM: CT ANGIOGRAPHY HEAD AND NECK TECHNIQUE: Multidetector CT imaging of the head and neck was performed using the standard protocol during bolus administration of intravenous contrast. Multiplanar CT image reconstructions and MIPs were obtained to evaluate the vascular anatomy. Carotid stenosis measurements (when applicable) are obtained utilizing NASCET criteria, using the distal internal carotid diameter as the denominator. CONTRAST:  75mL OMNIPAQUE IOHEXOL 350 MG/ML SOLN COMPARISON:  Head CT same day FINDINGS: CT HEAD FINDINGS Brain: Generalized atrophy as seen previously. Chronic small-vessel ischemic changes of the white matter. Ventriculomegaly due  to central atrophy. No sign of acute infarction, mass lesion, hemorrhage, hydrocephalus or extra-axial collection. Vascular: There is atherosclerotic calcification of the major vessels at the base of the brain. Skull: Negative Sinuses: Clear Orbits: Normal CTA NECK FINDINGS Aortic arch: Aortic atherosclerosis without aneurysm or dissection. Branching pattern is normal without origin stenosis. Right carotid system: Common carotid artery is widely patent to the bifurcation region. Calcified plaque at the carotid bifurcation and ICA bulb. Minimal diameter is 4 mm. Compared to a more distal cervical ICA diameter of 4 mm,  there is no stenosis. Left carotid system: Common carotid artery widely patent to the bifurcation. Calcified plaque at the carotid bifurcation and ICA bulb. Minimal diameter in the bulb is 4 mm. Compared to a more distal cervical ICA diameter of 4 mm, there is no stenosis. Vertebral arteries: Both vertebral artery origins show adjacent calcified plaque but there is no stenosis. Both vertebral arteries are patent through the cervical region to the foramen magnum. Skeleton: Ordinary cervical spondylosis. Other neck: No mass or lymphadenopathy. Upper chest: No active disease. Review of the MIP images confirms the above findings CTA HEAD FINDINGS Anterior circulation: Both internal carotid arteries are patent through the skull base and siphon regions. There is siphon atherosclerotic calcification but no stenosis greater than 30%. The anterior and middle cerebral vessels are patent without proximal stenosis, aneurysm or vascular malformation. No large or medium vessel occlusion is identified. Posterior circulation: Both vertebral arteries are patent through the foramen magnum to the basilar. There is some calcification in the V4 segments but without flow limiting stenosis. No basilar stenosis. Posterior circulation branch vessels are patent. Venous sinuses: Patent and normal. Anatomic variants: None significant. Review of the MIP images confirms the above findings IMPRESSION: 1. No significant finding by CT. Atrophy and chronic small-vessel ischemic changes. 2. No intracranial large or medium vessel occlusion or correctable proximal stenosis. 3. Atherosclerotic disease at both carotid bifurcations but without measurable stenosis. Electronically Signed   By: Paulina FusiMark  Shogry M.D.   On: 04/19/2019 22:23    EKG:   Orders placed or performed during the hospital encounter of 04/19/19  . EKG 12-Lead  . EKG 12-Lead    IMPRESSION AND PLAN:  Principal Problem:   Seizure [facility](HCC) -telemetry neurology evaluated the patient in  the ED and recommended IV Keppra, this was given.  CT and MRI scans of the head were done.  CT scan did not show any acute explanation for her episodes, MRI read is still pending.  EEG and neurology consult in the morning Active Problems:   HTN (hypertension) -home dose antihypertensives   CAD (coronary artery disease) -continue home medications   HLD (hyperlipidemia) -Home dose antilipid   Dementia (HCC) -continue home meds  Chart review performed and case discussed with ED provider. Labs, imaging and/or ECG reviewed by provider and discussed with patient/family. Management plans discussed with the patient and/or family.  COVID-19 status: Pending  DVT PROPHYLAXIS: SubQ lovenox   GI PROPHYLAXIS:  None  ADMISSION STATUS: Inpatient     CODE STATUS: Full Code Status History    Date Active Date Inactive Code Status Order ID Comments User Context   12/28/2016 0537 12/29/2016 1859 Full Code [id number]  Ihor AustinPyreddy, Pavan, MD Inpatient   Advance Care Planning Activity    Advance Directive Documentation     Most Recent Value  Type of Advance Directive  Out of facility DNR (pink MOST or yellow form) [Not with patient]  Pre-existing out of facility DNR order (yellow form  or pink MOST form)  -  "MOST" Form in Place?  -      TOTAL TIME TAKING CARE OF THIS PATIENT: 45 minutes.   This patient was evaluated in the context of the global COVID-19 pandemic, which necessitated consideration that the patient might be at risk for infection with the SARS-CoV-2 virus that causes COVID-19. Institutional protocols and algorithms that pertain to the evaluation of patients at risk for COVID-19 are in a state of rapid change based on information released by regulatory bodies including the CDC and federal and state organizations. These policies and algorithms were followed to the best of this provider's knowledge to date during the patient's care at this facility.  Barney Drain 04/19/2019, 10:29 PM  [facility]  Office  [phone]  CC: Primary care physician; Rolm Gala, MD  Note:  This document was prepared using Dragon voice recognition software and may include unintentional dictation errors.

## 2019-04-19 NOTE — ED Triage Notes (Signed)
Pt arrived from home via [facility]. Family reported siezure like activity, and that the pt has not been quite normal sense. Very slight confusion. BP 156/70, HR 80 with multifocal PVC's, CBG 126. Pt has recently been diagnosed with seizures and dementia, and is not taking any medication for the seizures. No cause has been identified for the seizures.

## 2019-04-19 NOTE — ED Notes (Signed)
Teleneuro cart placed in room, teleneuro called for consult

## 2019-04-19 NOTE — ED Notes (Signed)
ED TO INPATIENT HANDOFF REPORT  ED Nurse Name and Phone #: Betti Cruz Name/Age/Gender Hilma Favors 85 y.o. female Room/Bed: ED05A/ED05A  Code Status   Code Status: Prior  Home/SNF/Other Home Patient oriented to: self Is this baseline? Yes   Triage Complete: Triage complete  Chief Complaint possible seizure  Triage Note Pt arrived from home via [facility]. Family reported siezure like activity, and that the pt has not been quite normal sense. Very slight confusion. BP 156/70, HR 80 with multifocal PVC's, CBG 126. Pt has recently been diagnosed with seizures and dementia, and is not taking any medication for the seizures. No cause has been identified for the seizures.   Allergies Allergies  Allergen Reactions  . Omeprazole Other (See Comments)    Other Reaction: gi upset  . Penicillins Itching    Patient was adamant that he has a "strong allergy to penicillins"  . Atenolol Other (See Comments)    Other Reaction: DROP IN BP  . Levofloxacin Diarrhea  . Adhesive [Tape]   . Albuterol Other (See Comments)  . Atorvastatin Other (See Comments)  . Ciprofloxacin Other (See Comments)  . Codeine   . Diclofenac Other (See Comments)  . Fluoxetine Other (See Comments)  . Indomethacin Other (See Comments)  . Latex   . Losartan Other (See Comments)  . Oxybutynin Chloride Other (See Comments)  . Oxycodone-Acetaminophen Other (See Comments)  . Seroquel [Quetiapine Fumarate] Other (See Comments)    Dystonic reaction  . Sucralfate Other (See Comments)  . Tramadol Diarrhea    Pt and daughter report it gives her diarrhea. Usually probiotics are given to her to help per daughter.   . Gabapentin Nausea Only    Level of Care/Admitting Diagnosis ED Disposition    ED Disposition Condition Comment   Admit  Hospital Area: [facility] [[id number]]  Level of Care: Med-Surg [16]  Covid Evaluation: Asymptomatic Screening Protocol (No Symptoms)  Diagnosis: Seizure [facility])  [205090]  Admitting Physician: Oralia Manis [[id number]]  Attending Physician: Anne Hahn, DAVID [phone]  Estimated length of stay: past midnight tomorrow  Certification:: I certify this patient will need inpatient services for at least 2 midnights  PT Class (Do Not Modify): Inpatient [101]  PT Acc Code (Do Not Modify): Private [1]       B Medical/Surgery History Past Medical History:  Diagnosis Date  . Coronary artery disease   . Dementia (HCC)   . High cholesterol   . Hypertension   . Stroke [facility])    Past Surgical History:  Procedure Laterality Date  . ABDOMINAL HYSTERECTOMY    . CARDIAC SURGERY    . none       A IV Location/Drains/Wounds Patient Lines/Drains/Airways Status   Active Line/Drains/Airways    Name:   Placement date:   Placement time:   Site:   Days:   Peripheral IV 04/19/19 Left Antecubital   04/19/19    1800    Antecubital   less than 1   Colostomy RLQ   12/28/16    0549    RLQ   842          Intake/Output Last 24 hours  Intake/Output Summary (Last 24 hours) at 04/19/2019 2237 Last data filed at 04/19/2019 2139 Gross per 24 hour  Intake 100 ml  Output -  Net 100 ml    Labs/Imaging Results for orders placed or performed during the hospital encounter of 04/19/19 (from the past 48 hour(s))  CBC with Differential  Status: Abnormal   Collection Time: 04/19/19  6:38 PM  Result Value Ref Range   WBC 7.7 4.0 - 10.5 K/uL   RBC 4.07 3.87 - 5.11 MIL/uL   Hemoglobin 14.0 12.0 - 15.0 g/dL   HCT 40.9 81.1 - 91.4 %   MCV 101.2 (H) 80.0 - 100.0 fL   MCH 34.4 (H) 26.0 - 34.0 pg   MCHC 34.0 30.0 - 36.0 g/dL   RDW 78.2 95.6 - 21.3 %   Platelets 198 150 - 400 K/uL   nRBC 0.0 0.0 - 0.2 %   Neutrophils Relative % 39 %   Neutro Abs 3.0 1.7 - 7.7 K/uL   Lymphocytes Relative 47 %   Lymphs Abs 3.6 0.7 - 4.0 K/uL   Monocytes Relative 12 %   Monocytes Absolute 0.9 0.1 - 1.0 K/uL   Eosinophils Relative 2 %   Eosinophils Absolute 0.2 0.0 - 0.5 K/uL    Basophils Relative 0 %   Basophils Absolute 0.0 0.0 - 0.1 K/uL   Immature Granulocytes 0 %   Abs Immature Granulocytes 0.02 0.00 - 0.07 K/uL    Comment: Performed at [facility], [address] [place], [place] [address]  Basic metabolic panel     Status: Abnormal   Collection Time: 04/19/19  6:38 PM  Result Value Ref Range   Sodium 140 135 - 145 mmol/L   Potassium 4.4 3.5 - 5.1 mmol/L   Chloride 108 98 - 111 mmol/L   CO2 24 22 - 32 mmol/L   Glucose, Bld 98 70 - 99 mg/dL   BUN 27 (H) 8 - 23 mg/dL   Creatinine, Ser 8.46 0.44 - 1.00 mg/dL   Calcium 9.5 8.9 - 96.2 mg/dL   GFR calc non Af Amer 49 (L) >60 mL/min   GFR calc Af Amer 57 (L) >60 mL/min   Anion gap 8 5 - 15    Comment: Performed at [facility], [address] [place], [place] [address]  Urinalysis, Complete w Microscopic     Status: Abnormal   Collection Time: 04/19/19  6:38 PM  Result Value Ref Range   Color, Urine YELLOW (A) YELLOW   APPearance CLEAR (A) CLEAR   Specific Gravity, Urine 1.013 1.005 - 1.030   pH 6.0 5.0 - 8.0   Glucose, UA NEGATIVE NEGATIVE mg/dL   Hgb urine dipstick NEGATIVE NEGATIVE   Bilirubin Urine NEGATIVE NEGATIVE   Ketones, ur NEGATIVE NEGATIVE mg/dL   Protein, ur NEGATIVE NEGATIVE mg/dL   Nitrite NEGATIVE NEGATIVE   Leukocytes,Ua NEGATIVE NEGATIVE   RBC / HPF 0-5 0 - 5 RBC/hpf   WBC, UA 6-10 0 - 5 WBC/hpf   Bacteria, UA NONE SEEN NONE SEEN   Squamous Epithelial / LPF 0-5 0 - 5   Mucus PRESENT     Comment: Performed at [facility], [address] [place], [place] [address]  [place]     Status: None   Collection Time: 04/19/19  6:38 PM  Result Value Ref Range   Total CK 60 38 - 234 U/L    Comment: Performed at [facility], [address], [place], [place] [address]   Ct Angio Head W Or Wo Contrast  Result Date: 04/19/2019 CLINICAL DATA:  Seizure-like activity. Mental status changes. Dementia. EXAM: CT ANGIOGRAPHY HEAD AND NECK TECHNIQUE:  Multidetector CT imaging of the head and neck was performed using the standard protocol during bolus administration of intravenous contrast. Multiplanar CT image reconstructions and MIPs were obtained  to evaluate the vascular anatomy. Carotid stenosis measurements (when applicable) are obtained utilizing NASCET criteria, using the distal internal carotid diameter as the denominator. CONTRAST:  52mL OMNIPAQUE IOHEXOL 350 MG/ML SOLN COMPARISON:  Head CT same day FINDINGS: CT HEAD FINDINGS Brain: Generalized atrophy as seen previously. Chronic small-vessel ischemic changes of the white matter. Ventriculomegaly due to central atrophy. No sign of acute infarction, mass lesion, hemorrhage, hydrocephalus or extra-axial collection. Vascular: There is atherosclerotic calcification of the major vessels at the base of the brain. Skull: Negative Sinuses: Clear Orbits: Normal CTA NECK FINDINGS Aortic arch: Aortic atherosclerosis without aneurysm or dissection. Branching pattern is normal without origin stenosis. Right carotid system: Common carotid artery is widely patent to the bifurcation region. Calcified plaque at the carotid bifurcation and ICA bulb. Minimal diameter is 4 mm. Compared to a more distal cervical ICA diameter of 4 mm, there is no stenosis. Left carotid system: Common carotid artery widely patent to the bifurcation. Calcified plaque at the carotid bifurcation and ICA bulb. Minimal diameter in the bulb is 4 mm. Compared to a more distal cervical ICA diameter of 4 mm, there is no stenosis. Vertebral arteries: Both vertebral artery origins show adjacent calcified plaque but there is no stenosis. Both vertebral arteries are patent through the cervical region to the foramen magnum. Skeleton: Ordinary cervical spondylosis. Other neck: No mass or lymphadenopathy. Upper chest: No active disease. Review of the MIP images confirms the above findings CTA HEAD FINDINGS Anterior circulation: Both internal carotid arteries  are patent through the skull base and siphon regions. There is siphon atherosclerotic calcification but no stenosis greater than 30%. The anterior and middle cerebral vessels are patent without proximal stenosis, aneurysm or vascular malformation. No large or medium vessel occlusion is identified. Posterior circulation: Both vertebral arteries are patent through the foramen magnum to the basilar. There is some calcification in the V4 segments but without flow limiting stenosis. No basilar stenosis. Posterior circulation branch vessels are patent. Venous sinuses: Patent and normal. Anatomic variants: None significant. Review of the MIP images confirms the above findings IMPRESSION: 1. No significant finding by CT. Atrophy and chronic small-vessel ischemic changes. 2. No intracranial large or medium vessel occlusion or correctable proximal stenosis. 3. Atherosclerotic disease at both carotid bifurcations but without measurable stenosis. Electronically Signed   By: Paulina Fusi M.D.   On: 04/19/2019 22:23   Ct Head Wo Contrast  Result Date: 04/19/2019 CLINICAL DATA:  85 year old female with altered mental status. EXAM: CT HEAD WITHOUT CONTRAST TECHNIQUE: Contiguous axial images were obtained from the base of the skull through the vertex without intravenous contrast. COMPARISON:  Head CT dated 02/28/2019 FINDINGS: Brain: There is moderate age-related atrophy and chronic microvascular ischemic changes. Stable dilatation of the lateral and third ventricles similar to prior CT. There is no acute intracranial hemorrhage. No mass effect or midline shift. No extra-axial fluid collection. Vascular: No hyperdense vessel or unexpected calcification. Skull: Normal. Negative for fracture or focal lesion. Sinuses/Orbits: No acute finding. Other: None IMPRESSION: 1. No acute intracranial hemorrhage. 2. Stable atrophy and ventricular dilatation similar to prior CT. Electronically Signed   By: Elgie Collard M.D.   On:  04/19/2019 19:37   Ct Angio Neck W Or Wo Contrast  Result Date: 04/19/2019 CLINICAL DATA:  Seizure-like activity. Mental status changes. Dementia. EXAM: CT ANGIOGRAPHY HEAD AND NECK TECHNIQUE: Multidetector CT imaging of the head and neck was performed using the standard protocol during bolus administration of intravenous contrast. Multiplanar CT image reconstructions and  MIPs were obtained to evaluate the vascular anatomy. Carotid stenosis measurements (when applicable) are obtained utilizing NASCET criteria, using the distal internal carotid diameter as the denominator. CONTRAST:  34mL OMNIPAQUE IOHEXOL 350 MG/ML SOLN COMPARISON:  Head CT same day FINDINGS: CT HEAD FINDINGS Brain: Generalized atrophy as seen previously. Chronic small-vessel ischemic changes of the white matter. Ventriculomegaly due to central atrophy. No sign of acute infarction, mass lesion, hemorrhage, hydrocephalus or extra-axial collection. Vascular: There is atherosclerotic calcification of the major vessels at the base of the brain. Skull: Negative Sinuses: Clear Orbits: Normal CTA NECK FINDINGS Aortic arch: Aortic atherosclerosis without aneurysm or dissection. Branching pattern is normal without origin stenosis. Right carotid system: Common carotid artery is widely patent to the bifurcation region. Calcified plaque at the carotid bifurcation and ICA bulb. Minimal diameter is 4 mm. Compared to a more distal cervical ICA diameter of 4 mm, there is no stenosis. Left carotid system: Common carotid artery widely patent to the bifurcation. Calcified plaque at the carotid bifurcation and ICA bulb. Minimal diameter in the bulb is 4 mm. Compared to a more distal cervical ICA diameter of 4 mm, there is no stenosis. Vertebral arteries: Both vertebral artery origins show adjacent calcified plaque but there is no stenosis. Both vertebral arteries are patent through the cervical region to the foramen magnum. Skeleton: Ordinary cervical spondylosis.  Other neck: No mass or lymphadenopathy. Upper chest: No active disease. Review of the MIP images confirms the above findings CTA HEAD FINDINGS Anterior circulation: Both internal carotid arteries are patent through the skull base and siphon regions. There is siphon atherosclerotic calcification but no stenosis greater than 30%. The anterior and middle cerebral vessels are patent without proximal stenosis, aneurysm or vascular malformation. No large or medium vessel occlusion is identified. Posterior circulation: Both vertebral arteries are patent through the foramen magnum to the basilar. There is some calcification in the V4 segments but without flow limiting stenosis. No basilar stenosis. Posterior circulation branch vessels are patent. Venous sinuses: Patent and normal. Anatomic variants: None significant. Review of the MIP images confirms the above findings IMPRESSION: 1. No significant finding by CT. Atrophy and chronic small-vessel ischemic changes. 2. No intracranial large or medium vessel occlusion or correctable proximal stenosis. 3. Atherosclerotic disease at both carotid bifurcations but without measurable stenosis. Electronically Signed   By: Nelson Chimes M.D.   On: 04/19/2019 22:23    Pending Labs Unresulted Labs (From admission, onward)    Start     Ordered   04/19/19 2119  SARS CORONAVIRUS 2 (TAT 6-24 HRS) Nasopharyngeal Nasopharyngeal Swab  (Asymptomatic/Tier 2 Patients Labs)  Once,   STAT    Question Answer Comment  Is this test for diagnosis or screening Screening   Symptomatic for COVID-19 as defined by CDC No   Hospitalized for COVID-19 No   Admitted to ICU for COVID-19 No   Previously tested for COVID-19 Yes   Resident in a congregate (group) care setting No   Employed in healthcare setting No   Pregnant No      04/19/19 2118   Signed and Held  CBC  (enoxaparin (LOVENOX)    CrCl >/= 30 ml/min)  Once,   R    Comments: Baseline for enoxaparin therapy IF NOT ALREADY DRAWN.   Notify MD if PLT < 100 K.    Signed and Held   Signed and Held  Creatinine, serum  (enoxaparin (LOVENOX)    CrCl >/= 30 ml/min)  Once,   R  Comments: Baseline for enoxaparin therapy IF NOT ALREADY DRAWN.    Signed and Held   Signed and Held  Creatinine, serum  (enoxaparin (LOVENOX)    CrCl >/= 30 ml/min)  Weekly,   R    Comments: while on enoxaparin therapy    Signed and Held   Signed and Held  Basic metabolic panel  Tomorrow morning,   R     Signed and Held   Signed and Held  CBC  Tomorrow morning,   R     Signed and Held          Vitals/Pain Today's Vitals   04/19/19 1930 04/19/19 2000 04/19/19 2030 04/19/19 2100  BP: (!) 162/70 (!) 162/72 139/61 133/78  Pulse: 76 70 73 88  Resp: 10 13  12   Temp:      TempSrc:      SpO2: 99% 96% 97% 98%  Weight:      Height:      PainSc:        Isolation Precautions No active isolations  Medications Medications  levETIRAcetam (KEPPRA) IVPB 1000 mg/100 mL premix (0 mg Intravenous Stopped 04/19/19 2139)  iohexol (OMNIPAQUE) 350 MG/ML injection 75 mL (75 mLs Intravenous Contrast Given 04/19/19 2152)    Mobility non-ambulatory High fall risk   Focused Assessments Neuro Assessment Handoff:  Swallow screen pass? Yes  Cardiac Rhythm: Normal sinus rhythm   Last date known well: 04/19/19 Last time known well: 0900 Neuro Assessment: Exceptions to WDL Neuro Checks:      Last Documented NIHSS Modified Score:   Has TPA been given? No If patient is a Neuro Trauma and patient is going to OR before floor call report to 4N Charge nurse: [phone] or [phone]     R Recommendations: See Admitting Provider Note  Report given to:   Additional Notes:

## 2019-04-19 NOTE — ED Notes (Signed)
Patient's daughter will not stay overnight, but would like to let the nursing staff know that patient is very particular about her colostomy bag and even though she may think it's full, it may not be

## 2019-04-19 NOTE — Consult Note (Addendum)
Vitals:   04/19/19 1851 04/19/19 1930 04/19/19 2000 04/19/19 2030  BP: 138/68 (!) 162/70 (!) 162/72 139/61  Pulse: 79 76 70 73  Resp: 20 10 13    Temp: 98.5 F (36.9 C)     TempSrc: Oral     SpO2: 98% 99% 96% 97%  Weight:      Height:       cta head/neck  keppra  eeg tomorrow Mri tomorrow if cta head/neck inegattive     [facility] Tele[facility]rology Consult Services  Stat Consult  Date of Service:   04/19/2019 19:57:10  Impression:     .  Rule Out Acute Ischemic Stroke  Comments/Sign-Out: The patient was last walking normally at 4:30 pm, and she went upstairs to take a bath. She is outside of the window for tpa. She probably had a seizure.  CT HEAD: Showed No Acute Hemorrhage or Acute Core Infarct  Metrics: [facility] Notification Time: 04/19/2019 19:54:30 Stamp Time: 04/19/2019 19:57:10 Callback Response Time: 04/19/2019 19:59:08 Video Start Time: 04/19/2019 20:00:00 Video End Time: 04/19/2019 20:08:00  Our recommendations are outlined below.  Recommendations:     .  Antiplatelet Therapy  Imaging Studies:     .  MRI Head with and Without Contrast  Therapies:     .  Physical Therapy, Occupational Therapy, Speech Therapy Assessment When Applicable  Other WorkUp:     .  Infectious/metabolic workup per primary team     .  Check CMP     .  Check an ammonia level     .  Check B12 level     .  Check TSH     .  Check Urinalysis  Disposition: Neurology Follow Up Recommended  Sign Out:     .  Discussed with Emergency Department Provider  ----------------------------------------------------------------------------------------------------  Chief Complaint: epilepsy  History of Present Illness: Patient is a 85 year old Female.  47F. Mild dementia, CAD. No sz disorder. Multiple times seen in ED found on ground. Family witnessed pt go unresponsive with tonic/ clonic activity. Incontinent at baseline. Postictal period following. Workup  for cardiovascular issues and never for seizures. HCT negative.   Past Medical History:     . Hypertension     . Diabetes Mellitus     . Coronary Artery Disease     . There is NO history of Hyperlipidemia     . There is NO history of Atrial Fibrillation     . There is NO history of Stroke   Examination: BP(see note 162/72), Pulse(see note 79), Blood Glucose(see note) 1A: Level of Consciousness - Alert; keenly responsive + 0 1B: Ask Month and Age - Both Questions Right + 0 1C: Blink Eyes & Squeeze Hands - Performs Both Tasks + 0 2: Test Horizontal Extraocular Movements - Normal + 0 3: Test Visual Fields - No Visual Loss + 0 4: Test Facial Palsy (Use Grimace if Obtunded) - Normal symmetry + 0 5A: Test Left Arm Motor Drift - No Drift for 10 Seconds + 0 5B: Test Right Arm Motor Drift - No Drift for 10 Seconds + 0 6A: Test Left Leg Motor Drift - No Drift for 5 Seconds + 0 6B: Test Right Leg Motor Drift - No Drift for 5 Seconds + 0 7: Test Limb Ataxia (FNF/Heel-Shin) - No Ataxia + 0 8: Test Sensation - Normal; No sensory loss + 0 9: Test Language/Aphasia - Mild-Moderate Aphasia: Some Obvious Changes, Without Significant Limitation + 1 10: Test Dysarthria - Normal + 0  11: Test Extinction/Inattention - No abnormality + 0  NIHSS Score: 1   Due to the immediate potential for life-threatening deterioration due to underlying acute neurologic illness, I spent 35 minutes providing critical care. This time includes time for face to face visit via telemedicine, review of medical records, imaging studies and discussion of findings with providers, the patient and/or family.   Dr Rachael Fee   [facility] [phone]   Case [id number]

## 2019-04-19 NOTE — ED Provider Notes (Signed)
[facility] Emergency Department Provider Note  ____________________________________________   First MD Initiated Contact with Patient 04/19/19 1900     (approximate)  I have reviewed the triage vital signs and the nursing notes.   HISTORY  Chief Complaint Seizures    HPI Yolanda FavorsDoris L Moten is a 85 y.o. female  Here with seizure like activity. Per report, tp was in her usual state of health until this evening. She went to take a shower. She took longer than usual so family checked on her. She was then down, on the ground, dressed and out of the shower. She was witnessed to have flexion contraction of her b/l arms and was shaking, non-responsive. Lasted 10-15 min after which she was very drowsy, and "out of it." She slowly returned to normal. Now back to baseline. H/o dementia. She denies any complaints. No recent med changes other than initiating seroquel, but thsi has been months. No h/o seizures. No HA. No recent fever, chills.        Past Medical History:  Diagnosis Date   Coronary artery disease    Dementia (HCC)    High cholesterol    Hypertension    Stroke [facility](HCC)     Patient Active Problem List   Diagnosis Date Noted   HTN (hypertension) 04/19/2019   HLD (hyperlipidemia) 04/19/2019   Dementia (HCC) 04/19/2019   CAD (coronary artery disease) 04/19/2019   Seizure (HCC) 04/19/2019   Lumbar degenerative disc disease 12/29/2016   Spinal stenosis 12/29/2016   Pyuria, sterile 12/29/2016   Hyperglycemia 12/29/2016   Generalized weakness 12/29/2016   Back pain 12/28/2016    Past Surgical History:  Procedure Laterality Date   ABDOMINAL HYSTERECTOMY     CARDIAC SURGERY     none      Prior to Admission medications   Medication Sig Start Date End Date Taking? Authorizing Provider  acetaminophen (TYLENOL) 500 MG tablet Take 1,000 mg by mouth 3 (three) times daily.  11/27/08  Yes [provider]  aspirin EC 81 MG tablet  Take 1 tablet by mouth daily. 11/27/08  Yes [provider]  DULoxetine (CYMBALTA) 20 MG capsule Take 40 mg by mouth daily.   Yes [provider]  QUEtiapine (SEROQUEL) 50 MG tablet Take 50 mg by mouth at bedtime.   Yes [provider]  ramipril (ALTACE) 5 MG capsule Take 1 capsule by mouth daily. 12/16/16  Yes [provider]  TOPROL XL 25 MG 24 hr tablet Take 0.5 tablets by mouth daily. 11/10/16  Yes [provider]    Allergies Omeprazole, Penicillins, Atenolol, Levofloxacin, Adhesive [tape], Albuterol, Atorvastatin, Ciprofloxacin, Codeine, Diclofenac, Fluoxetine, Indomethacin, Latex, Losartan, Oxybutynin chloride, Oxycodone-acetaminophen, Seroquel [quetiapine fumarate], Sucralfate, Tramadol, and Gabapentin  Family History  Problem Relation Age of Onset   Renal Disease Mother    Heart failure Father     Social History Social History   Tobacco Use   Smoking status: Never Smoker   Smokeless tobacco: Never Used  Substance Use Topics   Alcohol use: No   Drug use: No    Review of Systems  Review of Systems  Unable to perform ROS: Dementia     ____________________________________________  PHYSICAL EXAM:      VITAL SIGNS: ED Triage Vitals  Enc Vitals Group     BP 04/19/19 1835 138/68     Pulse Rate 04/19/19 1835 76     Resp 04/19/19 1835 18     Temp 04/19/19 1835 98.5 F (36.9 C)  Temp Source 04/19/19 1835 Oral     SpO2 04/19/19 1835 99 %     Weight 04/19/19 1837 140 lb (63.5 kg)     Height 04/19/19 1837 5\' 1"  (1.549 m)     Head Circumference --      Peak Flow --      Pain Score 04/19/19 1836 0     Pain Loc --      Pain Edu? --      Excl. in GC? --      Physical Exam Vitals signs and nursing note reviewed.  Constitutional:      General: She is not in acute distress.    Appearance: She is well-developed.  HENT:     Head: Normocephalic and atraumatic.  Eyes:     Conjunctiva/sclera: Conjunctivae normal.    Neck:     Musculoskeletal: Neck supple.  Cardiovascular:     Rate and Rhythm: Normal rate and regular rhythm.     Heart sounds: Normal heart sounds. No murmur. No friction rub.  Pulmonary:     Effort: Pulmonary effort is normal. No respiratory distress.     Breath sounds: Normal breath sounds. No wheezing or rales.  Abdominal:     General: There is no distension.     Palpations: Abdomen is soft.     Tenderness: There is no abdominal tenderness.  Skin:    General: Skin is warm.     Capillary Refill: Capillary refill takes less than 2 seconds.  Neurological:     Mental Status: She is alert.     Motor: No abnormal muscle tone.     Comments: Oriented to person, place but not time at baseline. Moderate resting tremor. Strength 5/5 bl UE and LE. Normal sensation to light touch.       ____________________________________________   LABS (all labs ordered are listed, but only abnormal results are displayed)  Labs Reviewed  CBC WITH DIFFERENTIAL/PLATELET - Abnormal; Notable for the following components:      Result Value   MCV 101.2 (*)    MCH 34.4 (*)    All other components within normal limits  BASIC METABOLIC PANEL - Abnormal; Notable for the following components:   BUN 27 (*)    GFR calc non Af Amer 49 (*)    GFR calc Af Amer 57 (*)    All other components within normal limits  URINALYSIS, COMPLETE (UACMP) WITH MICROSCOPIC - Abnormal; Notable for the following components:   Color, Urine YELLOW (*)    APPearance CLEAR (*)    All other components within normal limits  SARS CORONAVIRUS 2 (TAT 6-24 HRS)  CK    ____________________________________________  EKG: Normal sinus rhythm, VR 77 with paired premature atrial complex. Anterior QW noted. No ST elevations. ________________________________________  RADIOLOGY All imaging, including plain films, CT scans, and ultrasounds, independently reviewed by me, and interpretations confirmed via formal radiology reads.  ED MD  interpretation:   CT Head: NAICA  Official radiology report(s): Ct Angio Head W Or Wo Contrast  Result Date: 04/19/2019 CLINICAL DATA:  Seizure-like activity. Mental status changes. Dementia. EXAM: CT ANGIOGRAPHY HEAD AND NECK TECHNIQUE: Multidetector CT imaging of the head and neck was performed using the standard protocol during bolus administration of intravenous contrast. Multiplanar CT image reconstructions and MIPs were obtained to evaluate the vascular anatomy. Carotid stenosis measurements (when applicable) are obtained utilizing NASCET criteria, using the distal internal carotid diameter as the denominator. CONTRAST:  72mL OMNIPAQUE IOHEXOL 350 MG/ML SOLN COMPARISON:  Head CT same day FINDINGS: CT HEAD FINDINGS Brain: Generalized atrophy as seen previously. Chronic small-vessel ischemic changes of the white matter. Ventriculomegaly due to central atrophy. No sign of acute infarction, mass lesion, hemorrhage, hydrocephalus or extra-axial collection. Vascular: There is atherosclerotic calcification of the major vessels at the base of the brain. Skull: Negative Sinuses: Clear Orbits: Normal CTA NECK FINDINGS Aortic arch: Aortic atherosclerosis without aneurysm or dissection. Branching pattern is normal without origin stenosis. Right carotid system: Common carotid artery is widely patent to the bifurcation region. Calcified plaque at the carotid bifurcation and ICA bulb. Minimal diameter is 4 mm. Compared to a more distal cervical ICA diameter of 4 mm, there is no stenosis. Left carotid system: Common carotid artery widely patent to the bifurcation. Calcified plaque at the carotid bifurcation and ICA bulb. Minimal diameter in the bulb is 4 mm. Compared to a more distal cervical ICA diameter of 4 mm, there is no stenosis. Vertebral arteries: Both vertebral artery origins show adjacent calcified plaque but there is no stenosis. Both vertebral arteries are patent through the cervical region to the foramen  magnum. Skeleton: Ordinary cervical spondylosis. Other neck: No mass or lymphadenopathy. Upper chest: No active disease. Review of the MIP images confirms the above findings CTA HEAD FINDINGS Anterior circulation: Both internal carotid arteries are patent through the skull base and siphon regions. There is siphon atherosclerotic calcification but no stenosis greater than 30%. The anterior and middle cerebral vessels are patent without proximal stenosis, aneurysm or vascular malformation. No large or medium vessel occlusion is identified. Posterior circulation: Both vertebral arteries are patent through the foramen magnum to the basilar. There is some calcification in the V4 segments but without flow limiting stenosis. No basilar stenosis. Posterior circulation branch vessels are patent. Venous sinuses: Patent and normal. Anatomic variants: None significant. Review of the MIP images confirms the above findings IMPRESSION: 1. No significant finding by CT. Atrophy and chronic small-vessel ischemic changes. 2. No intracranial large or medium vessel occlusion or correctable proximal stenosis. 3. Atherosclerotic disease at both carotid bifurcations but without measurable stenosis. Electronically Signed   By: Paulina Fusi M.D.   On: 04/19/2019 22:23   Ct Head Wo Contrast  Result Date: 04/19/2019 CLINICAL DATA:  85 year old female with altered mental status. EXAM: CT HEAD WITHOUT CONTRAST TECHNIQUE: Contiguous axial images were obtained from the base of the skull through the vertex without intravenous contrast. COMPARISON:  Head CT dated 02/28/2019 FINDINGS: Brain: There is moderate age-related atrophy and chronic microvascular ischemic changes. Stable dilatation of the lateral and third ventricles similar to prior CT. There is no acute intracranial hemorrhage. No mass effect or midline shift. No extra-axial fluid collection. Vascular: No hyperdense vessel or unexpected calcification. Skull: Normal. Negative for  fracture or focal lesion. Sinuses/Orbits: No acute finding. Other: None IMPRESSION: 1. No acute intracranial hemorrhage. 2. Stable atrophy and ventricular dilatation similar to prior CT. Electronically Signed   By: Elgie Collard M.D.   On: 04/19/2019 19:37   Ct Angio Neck W Or Wo Contrast  Result Date: 04/19/2019 CLINICAL DATA:  Seizure-like activity. Mental status changes. Dementia. EXAM: CT ANGIOGRAPHY HEAD AND NECK TECHNIQUE: Multidetector CT imaging of the head and neck was performed using the standard protocol during bolus administration of intravenous contrast. Multiplanar CT image reconstructions and MIPs were obtained to evaluate the vascular anatomy. Carotid stenosis measurements (when applicable) are obtained utilizing NASCET criteria, using the distal internal carotid diameter as the denominator. CONTRAST:  75mL OMNIPAQUE IOHEXOL 350 MG/ML  SOLN COMPARISON:  Head CT same day FINDINGS: CT HEAD FINDINGS Brain: Generalized atrophy as seen previously. Chronic small-vessel ischemic changes of the white matter. Ventriculomegaly due to central atrophy. No sign of acute infarction, mass lesion, hemorrhage, hydrocephalus or extra-axial collection. Vascular: There is atherosclerotic calcification of the major vessels at the base of the brain. Skull: Negative Sinuses: Clear Orbits: Normal CTA NECK FINDINGS Aortic arch: Aortic atherosclerosis without aneurysm or dissection. Branching pattern is normal without origin stenosis. Right carotid system: Common carotid artery is widely patent to the bifurcation region. Calcified plaque at the carotid bifurcation and ICA bulb. Minimal diameter is 4 mm. Compared to a more distal cervical ICA diameter of 4 mm, there is no stenosis. Left carotid system: Common carotid artery widely patent to the bifurcation. Calcified plaque at the carotid bifurcation and ICA bulb. Minimal diameter in the bulb is 4 mm. Compared to a more distal cervical ICA diameter of 4 mm, there is no  stenosis. Vertebral arteries: Both vertebral artery origins show adjacent calcified plaque but there is no stenosis. Both vertebral arteries are patent through the cervical region to the foramen magnum. Skeleton: Ordinary cervical spondylosis. Other neck: No mass or lymphadenopathy. Upper chest: No active disease. Review of the MIP images confirms the above findings CTA HEAD FINDINGS Anterior circulation: Both internal carotid arteries are patent through the skull base and siphon regions. There is siphon atherosclerotic calcification but no stenosis greater than 30%. The anterior and middle cerebral vessels are patent without proximal stenosis, aneurysm or vascular malformation. No large or medium vessel occlusion is identified. Posterior circulation: Both vertebral arteries are patent through the foramen magnum to the basilar. There is some calcification in the V4 segments but without flow limiting stenosis. No basilar stenosis. Posterior circulation branch vessels are patent. Venous sinuses: Patent and normal. Anatomic variants: None significant. Review of the MIP images confirms the above findings IMPRESSION: 1. No significant finding by CT. Atrophy and chronic small-vessel ischemic changes. 2. No intracranial large or medium vessel occlusion or correctable proximal stenosis. 3. Atherosclerotic disease at both carotid bifurcations but without measurable stenosis. Electronically Signed   By: Nelson Chimes M.D.   On: 04/19/2019 22:23    ____________________________________________  PROCEDURES   Procedure(s) performed (including Critical Care):  Procedures  ____________________________________________  INITIAL IMPRESSION / MDM / [place] / ED COURSE  As part of my medical decision making, I reviewed the following data within the electronic MEDICAL RECORD NUMBER Notes from prior ED visits and [place] Controlled Substance Database      *DANIALLE DEMENT was evaluated in Emergency Department on  04/19/2019 for the symptoms described in the history of present illness. She was evaluated in the context of the global COVID-19 pandemic, which necessitated consideration that the patient might be at risk for infection with the SARS-CoV-2 virus that causes COVID-19. Institutional protocols and algorithms that pertain to the evaluation of patients at risk for COVID-19 are in a state of rapid change based on information released by regulatory bodies including the CDC and federal and state organizations. These policies and algorithms were followed during the patient's care in the ED.  Some ED evaluations and interventions may be delayed as a result of limited staffing during the pandemic.*   Clinical Course as of Apr 18 2333  Fri Apr 18, 8564  [id number] 85 year old female here with suspected seizure-like activity.  She is back to her mental baseline now.  Suspect this is related to her underlying dementia and age.  No ongoing seizure activity here, but neurology consulted and patient will be loaded with Keppra and sent for CT angio.  Plan to admit to medicine for MRI and EEG in the morning.   [CI]    Clinical Course User Index [CI] Shaune Pollack, MD    Medical Decision Making:  As above  ____________________________________________  FINAL CLINICAL IMPRESSION(S) / ED DIAGNOSES  Final diagnoses:  Seizure (HCC)     MEDICATIONS GIVEN DURING THIS VISIT:  Medications  levETIRAcetam (KEPPRA) IVPB 1000 mg/100 mL premix (0 mg Intravenous Stopped 04/19/19 2139)  iohexol (OMNIPAQUE) 350 MG/ML injection 75 mL (75 mLs Intravenous Contrast Given 04/19/19 2152)     ED Discharge Orders    None       Note:  This document was prepared using Dragon voice recognition software and may include unintentional dictation errors.   Shaune Pollack, MD 04/19/19 [phone]

## 2019-04-20 LAB — CBC
HCT: 40.5 % (ref 36.0–46.0)
Hemoglobin: 14.1 g/dL (ref 12.0–15.0)
MCH: 34.2 pg — ABNORMAL HIGH (ref 26.0–34.0)
MCHC: 34.8 g/dL (ref 30.0–36.0)
MCV: 98.3 fL (ref 80.0–100.0)
Platelets: 195 10*3/uL (ref 150–400)
RBC: 4.12 MIL/uL (ref 3.87–5.11)
RDW: 12.9 % (ref 11.5–15.5)
WBC: 8 10*3/uL (ref 4.0–10.5)
nRBC: 0 % (ref 0.0–0.2)

## 2019-04-20 LAB — BASIC METABOLIC PANEL
Anion gap: 9 (ref 5–15)
BUN: 21 mg/dL (ref 8–23)
CO2: 22 mmol/L (ref 22–32)
Calcium: 9.4 mg/dL (ref 8.9–10.3)
Chloride: 109 mmol/L (ref 98–111)
Creatinine, Ser: 0.86 mg/dL (ref 0.44–1.00)
GFR calc Af Amer: >60 mL/min (ref >60)
GFR calc non Af Amer: 59 mL/min — ABNORMAL LOW (ref >60)
Glucose, Bld: 100 mg/dL — ABNORMAL HIGH (ref 70–99)
Potassium: 3.6 mmol/L (ref 3.5–5.1)
Sodium: 140 mmol/L (ref 135–145)

## 2019-04-20 LAB — SARS CORONAVIRUS 2 (TAT 6-24 HRS): SARS Coronavirus 2: NEGATIVE

## 2019-04-20 MED ORDER — ACETAMINOPHEN 650 MG RE SUPP: 650.0000 mg | Freq: Four times a day (QID) | RECTAL | Status: DC | PRN

## 2019-04-20 MED ORDER — ONDANSETRON HCL 4 MG/2ML IJ SOLN: 4.0000 mg | Freq: Four times a day (QID) | INTRAMUSCULAR | Status: DC | PRN

## 2019-04-20 MED ORDER — SODIUM CHLORIDE 0.9 % IV SOLN
INTRAVENOUS | Status: AC
  Administered 2019-04-20: 01:00:00 via INTRAVENOUS

## 2019-04-20 MED ORDER — LEVETIRACETAM 250 MG PO TABS: 250.0000 mg | ORAL_TABLET | Freq: Two times a day (BID) | ORAL | 0 refills | Status: DC

## 2019-04-20 MED ORDER — ENOXAPARIN SODIUM 40 MG/0.4ML ~~LOC~~ SOLN
40.0000 mg | SUBCUTANEOUS | Status: DC
  Administered 2019-04-20: 40 mg via SUBCUTANEOUS
  Filled 2019-04-20: qty 0.4

## 2019-04-20 MED ORDER — ACETAMINOPHEN 325 MG PO TABS: 650.0000 mg | ORAL_TABLET | Freq: Four times a day (QID) | ORAL | Status: DC | PRN

## 2019-04-20 MED ORDER — ONDANSETRON HCL 4 MG PO TABS: 4.0000 mg | ORAL_TABLET | Freq: Four times a day (QID) | ORAL | Status: DC | PRN

## 2019-04-20 NOTE — Discharge Summary (Signed)
[employer] - [place] at [facility]   PATIENT NAME: Yolanda Casey    MR#:  [id number]  DATE OF BIRTH:  1926/01/17  DATE OF ADMISSION:  04/19/2019 ADMITTING PHYSICIAN: Oralia Manis, MD  DATE OF DISCHARGE: 10/17/202  PRIMARY CARE PHYSICIAN: Rolm Gala, MD    ADMISSION DIAGNOSIS:  Seizure (HCC) [R56.9]  DISCHARGE DIAGNOSIS:  Principal Problem:   Seizure (HCC) Active Problems:   HTN (hypertension)   HLD (hyperlipidemia)   Dementia (HCC)   CAD (coronary artery disease)   SECONDARY DIAGNOSIS:   Past Medical History:  Diagnosis Date  . Coronary artery disease   . Dementia (HCC)   . High cholesterol   . Hypertension   . Stroke [facility])     HOSPITAL COURSE:    85 year old female with dementia who does not drive who presents to the emergency room due to concern of seizures.  1.  Acute encephalopathy: Symptoms are concerning for partial seizure episodes as per neurology.  She had no events while in the hospital.  She will be discharged on low-dose Keppra.  She does not drive.  She will have outpatient follow-up with neurology.  Imaging was essentially unremarkable.  EEG will be done as an outpatient with neurology.  2.  Essential hypertension: Continue Altace and Toprol  3.  Hyperlipidemia: Continue statin      DISCHARGE CONDITIONS AND DIET:  Regular diet  CONSULTS OBTAINED:  Treatment Team:  Pauletta Browns, MD  DRUG ALLERGIES:   Allergies  Allergen Reactions  . Omeprazole Other (See Comments)    Other Reaction: gi upset  . Penicillins Itching and Other (See Comments)    Patient was adamant that he has a "strong allergy to penicillins" Did it involve swelling of the face/tongue/throat, SOB, or low BP? Unknown Did it involve sudden or severe rash/hives, skin peeling, or any reaction on the inside of your mouth or nose? Unknown Did you need to seek medical attention at a hospital or doctor's office? Unknown When did it last  happen?unknown If all above answers are "NO", may proceed with cephalosporin use.   . Atenolol Other (See Comments)    Other Reaction: DROP IN BP  . Levofloxacin Diarrhea  . Adhesive [Tape] Other (See Comments)    Reaction: unknown  . Albuterol Other (See Comments)    Reaction: unknown  . Atorvastatin Other (See Comments)    Reaction: unknown  . Ciprofloxacin Other (See Comments)    Reaction: unknown  . Codeine Other (See Comments)    Reaction: unknown  . Diclofenac Other (See Comments)    Reaction: unknown  . Fluoxetine Other (See Comments)    Reaction: unknown  . Indomethacin Other (See Comments)    Reaction: unknown  . Latex Other (See Comments)    Reaction: unknown  . Losartan Other (See Comments)    Reaction: unknown  . Oxybutynin Chloride Other (See Comments)    Reaction: unknown  . Oxycodone-Acetaminophen Other (See Comments)    Reaction: unknown  . Seroquel [Quetiapine Fumarate] Other (See Comments)    Dystonic reaction  . Sucralfate Other (See Comments)    Reaction: unknown  . Tramadol Diarrhea    Pt and daughter report it gives her diarrhea. Usually probiotics are given to her to help per daughter.   . Gabapentin Nausea Only    DISCHARGE MEDICATIONS:   Allergies as of 04/20/2019      Reactions   Omeprazole Other (See Comments)   Other Reaction: gi upset   Penicillins Itching, Other (See  Comments)   Patient was adamant that he has a "strong allergy to penicillins" Did it involve swelling of the face/tongue/throat, SOB, or low BP? Unknown Did it involve sudden or severe rash/hives, skin peeling, or any reaction on the inside of your mouth or nose? Unknown Did you need to seek medical attention at a hospital or doctor's office? Unknown When did it last happen?unknown If all above answers are "NO", may proceed with cephalosporin use.   Atenolol Other (See Comments)   Other Reaction: DROP IN BP   Levofloxacin Diarrhea   Adhesive [tape] Other (See  Comments)   Reaction: unknown   Albuterol Other (See Comments)   Reaction: unknown   Atorvastatin Other (See Comments)   Reaction: unknown   Ciprofloxacin Other (See Comments)   Reaction: unknown   Codeine Other (See Comments)   Reaction: unknown   Diclofenac Other (See Comments)   Reaction: unknown   Fluoxetine Other (See Comments)   Reaction: unknown   Indomethacin Other (See Comments)   Reaction: unknown   Latex Other (See Comments)   Reaction: unknown   Losartan Other (See Comments)   Reaction: unknown   Oxybutynin Chloride Other (See Comments)   Reaction: unknown   Oxycodone-acetaminophen Other (See Comments)   Reaction: unknown   Seroquel [quetiapine Fumarate] Other (See Comments)   Dystonic reaction   Sucralfate Other (See Comments)   Reaction: unknown   Tramadol Diarrhea   Pt and daughter report it gives her diarrhea. Usually probiotics are given to her to help per daughter.    Gabapentin Nausea Only      Medication List    TAKE these medications   aspirin EC 81 MG tablet Take 1 tablet by mouth daily.   DULoxetine 20 MG capsule Commonly known as: CYMBALTA Take 40 mg by mouth daily.   levETIRAcetam 250 MG tablet Commonly known as: Keppra Take 1 tablet (250 mg total) by mouth 2 (two) times daily.   QUEtiapine 50 MG tablet Commonly known as: SEROQUEL Take 50 mg by mouth at bedtime.   ramipril 5 MG capsule Commonly known as: ALTACE Take 1 capsule by mouth daily.   Toprol XL 25 MG 24 hr tablet Generic drug: metoprolol succinate Take 0.5 tablets by mouth daily.   TYLENOL 500 MG tablet Generic drug: acetaminophen Take 1,000 mg by mouth 3 (three) times daily.         Today   CHIEF COMPLAINT:  Patient with dementia No events overnight.   VITAL SIGNS:  Blood pressure (!) 145/67, pulse 71, temperature (!) 97.3 F (36.3 C), temperature source Oral, resp. rate 16, height  (1.6 m), weight 60.4 kg, SpO2 93 %.   REVIEW OF SYSTEMS:  Review  of Systems  Unable to perform ROS: Dementia     PHYSICAL EXAMINATION:  GENERAL:  85 y.o.-year-old patient lying in the bed with no acute distress.  NECK:  Supple, no jugular venous distention. No thyroid enlargement, no tenderness.  LUNGS: Normal breath sounds bilaterally, no wheezing, rales,rhonchi  No use of accessory muscles of respiration.  CARDIOVASCULAR: S1, S2 normal. No murmurs, rubs, or gallops.  ABDOMEN: Soft, non-tender, non-distended. Bowel sounds present. No organomegaly or mass.  Colostomy bag  EXTREMITIES: No pedal edema, cyanosis, or clubbing.  PSYCHIATRIC: The patient is alert and oriented x 3.  SKIN: No obvious rash, lesion, or ulcer.   DATA REVIEW:   CBC Recent Labs  Lab 04/20/19 0433  WBC 8.0  HGB 14.1  HCT 40.5  PLT 195  Chemistries  Recent Labs  Lab 04/20/19 0433  NA 140  K 3.6  CL 109  CO2 22  GLUCOSE 100*  BUN 21  CREATININE 0.86  CALCIUM 9.4    Cardiac Enzymes No results for input(s): TROPONINI in the last 168 hours.  Microbiology Results  @[email or web address] @  RADIOLOGY:  Ct Angio Head W Or Wo Contrast  Result Date: 04/19/2019 CLINICAL DATA:  Seizure-like activity. Mental status changes. Dementia. EXAM: CT ANGIOGRAPHY HEAD AND NECK TECHNIQUE: Multidetector CT imaging of the head and neck was performed using the standard protocol during bolus administration of intravenous contrast. Multiplanar CT image reconstructions and MIPs were obtained to evaluate the vascular anatomy. Carotid stenosis measurements (when applicable) are obtained utilizing NASCET criteria, using the distal internal carotid diameter as the denominator. CONTRAST:  75mL OMNIPAQUE IOHEXOL 350 MG/ML SOLN COMPARISON:  Head CT same day FINDINGS: CT HEAD FINDINGS Brain: Generalized atrophy as seen previously. Chronic small-vessel ischemic changes of the white matter. Ventriculomegaly due to central atrophy. No sign of acute infarction, mass lesion, hemorrhage, hydrocephalus or  extra-axial collection. Vascular: There is atherosclerotic calcification of the major vessels at the base of the brain. Skull: Negative Sinuses: Clear Orbits: Normal CTA NECK FINDINGS Aortic arch: Aortic atherosclerosis without aneurysm or dissection. Branching pattern is normal without origin stenosis. Right carotid system: Common carotid artery is widely patent to the bifurcation region. Calcified plaque at the carotid bifurcation and ICA bulb. Minimal diameter is 4 mm. Compared to a more distal cervical ICA diameter of 4 mm, there is no stenosis. Left carotid system: Common carotid artery widely patent to the bifurcation. Calcified plaque at the carotid bifurcation and ICA bulb. Minimal diameter in the bulb is 4 mm. Compared to a more distal cervical ICA diameter of 4 mm, there is no stenosis. Vertebral arteries: Both vertebral artery origins show adjacent calcified plaque but there is no stenosis. Both vertebral arteries are patent through the cervical region to the foramen magnum. Skeleton: Ordinary cervical spondylosis. Other neck: No mass or lymphadenopathy. Upper chest: No active disease. Review of the MIP images confirms the above findings CTA HEAD FINDINGS Anterior circulation: Both internal carotid arteries are patent through the skull base and siphon regions. There is siphon atherosclerotic calcification but no stenosis greater than 30%. The anterior and middle cerebral vessels are patent without proximal stenosis, aneurysm or vascular malformation. No large or medium vessel occlusion is identified. Posterior circulation: Both vertebral arteries are patent through the foramen magnum to the basilar. There is some calcification in the V4 segments but without flow limiting stenosis. No basilar stenosis. Posterior circulation branch vessels are patent. Venous sinuses: Patent and normal. Anatomic variants: None significant. Review of the MIP images confirms the above findings IMPRESSION: 1. No significant  finding by CT. Atrophy and chronic small-vessel ischemic changes. 2. No intracranial large or medium vessel occlusion or correctable proximal stenosis. 3. Atherosclerotic disease at both carotid bifurcations but without measurable stenosis. Electronically Signed   By: Paulina FusiMark  Shogry M.D.   On: 04/19/2019 22:23   Ct Head Wo Contrast  Result Date: 04/19/2019 CLINICAL DATA:  85 year old female with altered mental status. EXAM: CT HEAD WITHOUT CONTRAST TECHNIQUE: Contiguous axial images were obtained from the base of the skull through the vertex without intravenous contrast. COMPARISON:  Head CT dated 02/28/2019 FINDINGS: Brain: There is moderate age-related atrophy and chronic microvascular ischemic changes. Stable dilatation of the lateral and third ventricles similar to prior CT. There is no acute intracranial hemorrhage. No mass effect or midline  shift. No extra-axial fluid collection. Vascular: No hyperdense vessel or unexpected calcification. Skull: Normal. Negative for fracture or focal lesion. Sinuses/Orbits: No acute finding. Other: None IMPRESSION: 1. No acute intracranial hemorrhage. 2. Stable atrophy and ventricular dilatation similar to prior CT. Electronically Signed   By: Elgie Collard M.D.   On: 04/19/2019 19:37   Ct Angio Neck W Or Wo Contrast  Result Date: 04/19/2019 CLINICAL DATA:  Seizure-like activity. Mental status changes. Dementia. EXAM: CT ANGIOGRAPHY HEAD AND NECK TECHNIQUE: Multidetector CT imaging of the head and neck was performed using the standard protocol during bolus administration of intravenous contrast. Multiplanar CT image reconstructions and MIPs were obtained to evaluate the vascular anatomy. Carotid stenosis measurements (when applicable) are obtained utilizing NASCET criteria, using the distal internal carotid diameter as the denominator. CONTRAST:  24mL OMNIPAQUE IOHEXOL 350 MG/ML SOLN COMPARISON:  Head CT same day FINDINGS: CT HEAD FINDINGS Brain: Generalized atrophy  as seen previously. Chronic small-vessel ischemic changes of the white matter. Ventriculomegaly due to central atrophy. No sign of acute infarction, mass lesion, hemorrhage, hydrocephalus or extra-axial collection. Vascular: There is atherosclerotic calcification of the major vessels at the base of the brain. Skull: Negative Sinuses: Clear Orbits: Normal CTA NECK FINDINGS Aortic arch: Aortic atherosclerosis without aneurysm or dissection. Branching pattern is normal without origin stenosis. Right carotid system: Common carotid artery is widely patent to the bifurcation region. Calcified plaque at the carotid bifurcation and ICA bulb. Minimal diameter is 4 mm. Compared to a more distal cervical ICA diameter of 4 mm, there is no stenosis. Left carotid system: Common carotid artery widely patent to the bifurcation. Calcified plaque at the carotid bifurcation and ICA bulb. Minimal diameter in the bulb is 4 mm. Compared to a more distal cervical ICA diameter of 4 mm, there is no stenosis. Vertebral arteries: Both vertebral artery origins show adjacent calcified plaque but there is no stenosis. Both vertebral arteries are patent through the cervical region to the foramen magnum. Skeleton: Ordinary cervical spondylosis. Other neck: No mass or lymphadenopathy. Upper chest: No active disease. Review of the MIP images confirms the above findings CTA HEAD FINDINGS Anterior circulation: Both internal carotid arteries are patent through the skull base and siphon regions. There is siphon atherosclerotic calcification but no stenosis greater than 30%. The anterior and middle cerebral vessels are patent without proximal stenosis, aneurysm or vascular malformation. No large or medium vessel occlusion is identified. Posterior circulation: Both vertebral arteries are patent through the foramen magnum to the basilar. There is some calcification in the V4 segments but without flow limiting stenosis. No basilar stenosis. Posterior  circulation branch vessels are patent. Venous sinuses: Patent and normal. Anatomic variants: None significant. Review of the MIP images confirms the above findings IMPRESSION: 1. No significant finding by CT. Atrophy and chronic small-vessel ischemic changes. 2. No intracranial large or medium vessel occlusion or correctable proximal stenosis. 3. Atherosclerotic disease at both carotid bifurcations but without measurable stenosis. Electronically Signed   By: Paulina Fusi M.D.   On: 04/19/2019 22:23   Mr Brain Wo Contrast  Result Date: 04/19/2019 CLINICAL DATA:  Initial evaluation for acute seizure like activity, confusion. EXAM: MRI HEAD WITHOUT CONTRAST TECHNIQUE: Multiplanar, multiecho pulse sequences of the brain and surrounding structures were obtained without intravenous contrast. COMPARISON:  Prior CT and CTA from earlier same day. FINDINGS: Brain: Generalized age-related cerebral atrophy. Patchy and confluent T2/FLAIR hyperintensity within the periventricular deep white matter both cerebral hemispheres most consistent with chronic small vessel ischemic disease,  moderate in nature. No abnormal foci of restricted diffusion to suggest acute or subacute ischemia. Tiny focus of apparent diffusion signal abnormality involving the central aspect of the splenium on axial DWI image 29 favored to be artifactual nature, with no signal abnormality seen on corresponding FLAIR sequence. Gray-white matter differentiation maintained without evidence for chronic cortical infarction. No acute intracranial hemorrhage. Single chronic microhemorrhage noted at the subcortical right frontal lobe, of doubtful significance in isolation. No mass lesion, midline shift or mass effect. No intrinsic temporal lobe abnormality. Diffuse ventricular prominence related global parenchymal volume loss of hydrocephalus. No extra-axial fluid collection. Pituitary gland suprasellar region within normal limits. Midline structures intact.  Vascular: Major intracranial vascular flow voids are maintained. Skull and upper cervical spine: Craniocervical junction within normal limits. Bone marrow signal intensity normal. No scalp soft tissue abnormality. Sinuses/Orbits: Patient status post bilateral ocular lens replacement. Globes orbital soft tissues demonstrate no acute finding. Mild scattered mucosal thickening noted within the ethmoidal air cells. Paranasal sinuses are otherwise clear. No mastoid effusion. Inner ear structures normal. Other: None. IMPRESSION: 1. No acute intracranial abnormality. 2. Generalized age-related cerebral atrophy with moderate chronic small vessel ischemic disease. Electronically Signed   By: Rise Mu M.D.   On: 04/19/2019 23:20      Allergies as of 04/20/2019      Reactions   Omeprazole Other (See Comments)   Other Reaction: gi upset   Penicillins Itching, Other (See Comments)   Patient was adamant that he has a "strong allergy to penicillins" Did it involve swelling of the face/tongue/throat, SOB, or low BP? Unknown Did it involve sudden or severe rash/hives, skin peeling, or any reaction on the inside of your mouth or nose? Unknown Did you need to seek medical attention at a hospital or doctor's office? Unknown When did it last happen?unknown If all above answers are "NO", may proceed with cephalosporin use.   Atenolol Other (See Comments)   Other Reaction: DROP IN BP   Levofloxacin Diarrhea   Adhesive [tape] Other (See Comments)   Reaction: unknown   Albuterol Other (See Comments)   Reaction: unknown   Atorvastatin Other (See Comments)   Reaction: unknown   Ciprofloxacin Other (See Comments)   Reaction: unknown   Codeine Other (See Comments)   Reaction: unknown   Diclofenac Other (See Comments)   Reaction: unknown   Fluoxetine Other (See Comments)   Reaction: unknown   Indomethacin Other (See Comments)   Reaction: unknown   Latex Other (See Comments)   Reaction: unknown    Losartan Other (See Comments)   Reaction: unknown   Oxybutynin Chloride Other (See Comments)   Reaction: unknown   Oxycodone-acetaminophen Other (See Comments)   Reaction: unknown   Seroquel [quetiapine Fumarate] Other (See Comments)   Dystonic reaction   Sucralfate Other (See Comments)   Reaction: unknown   Tramadol Diarrhea   Pt and daughter report it gives her diarrhea. Usually probiotics are given to her to help per daughter.    Gabapentin Nausea Only      Medication List    TAKE these medications   aspirin EC 81 MG tablet Take 1 tablet by mouth daily.   DULoxetine 20 MG capsule Commonly known as: CYMBALTA Take 40 mg by mouth daily.   levETIRAcetam 250 MG tablet Commonly known as: Keppra Take 1 tablet (250 mg total) by mouth 2 (two) times daily.   QUEtiapine 50 MG tablet Commonly known as: SEROQUEL Take 50 mg by mouth at bedtime.  ramipril 5 MG capsule Commonly known as: ALTACE Take 1 capsule by mouth daily.   Toprol XL 25 MG 24 hr tablet Generic drug: metoprolol succinate Take 0.5 tablets by mouth daily.   TYLENOL 500 MG tablet Generic drug: acetaminophen Take 1,000 mg by mouth 3 (three) times daily.         Management plans discussed with  neurology  Patient should follow up with neurology  CODE STATUS:     Code Status Orders  (From admission, onward)         Start     Ordered   04/20/19 0011  Full code  Continuous     04/20/19 0010        Code Status History    Date Active Date Inactive Code Status Order ID Comments User Context   12/28/2016 0537 12/29/2016 1859 Full Code [id number]  Saundra Shelling, MD Inpatient   Advance Care Planning Activity    Advance Directive Documentation     Most Recent Value  Type of Advance Directive  Out of facility DNR (pink MOST or yellow form) [Not with patient]  Pre-existing out of facility DNR order (yellow form or pink MOST form)  -  "MOST" Form in Place?  -      TOTAL TIME TAKING CARE OF THIS  PATIENT: 38 minutes.    Note: This dictation was prepared with Dragon dictation along with smaller phrase technology. Any transcriptional errors that result from this process are unintentional.  Bettey Costa M.D on 04/20/2019 at 10:01 AM  Between 7am to 6pm - Pager - [phone] After 6pm go to [email or web address] - password [email or web address] [facility]  Office  [phone]  CC: Primary care physician; Hortencia Pilar, MD

## 2019-04-20 NOTE — Care Management CC44 (Signed)
Condition Code 44 Documentation Completed  Patient Details  Name: Yolanda Casey MRN: [id number] Date of Birth: 1925-11-02   Condition Code 44 given:  Yes Patient signature on Condition Code 44 notice:  Yes Documentation of 2 MD's agreement:  Yes Code 44 added to claim:  Yes    Latanya Maudlin, RN 04/20/2019, 11:03 AM

## 2019-04-20 NOTE — TOC Transition Note (Signed)
Transition of Care [facility]) - CM/SW Discharge Note   Patient Details  Name: Yolanda Casey MRN: [id number] Date of Birth: 1926-06-01  [facility]) CM/SW Contact:  Latanya Maudlin, RN Phone Number: 04/20/2019, 10:26 AM   Clinical Narrative:  Patient to be discharged per MD order. Orders in place for home health services. Spoke primarily with daughter as the patient has dementia. Daughter is primary caregiver for patient who requires "around the clock monitoring". Daughter has hired private caregivers for a few days a week to assist. Patient is "mostly" independent and is "too proud to do PT or even use a cane". Daughter is agreeable to home health particularly for RN medication management and SW to assist with community resources as able. CMS Medicare.gov Compare Post Acute Care list reviewed with daughter and they have no preference of agency. Referral placed with [place] [facility] [place] care. No DME needs.     Final next level of care: Home w Home Health Services Barriers to Discharge: No Barriers Identified   Patient Goals and CMS Choice   CMS Medicare.gov Compare Post Acute Care list provided to:: Patient Choice offered to / list presented to : Patient  Discharge Placement                       Discharge Plan and Services                          HH Arranged: RN, PT, Nurse's Aide, Social Work [facility] Agency: [place] ([facility]) Date [place]: 04/20/19 Time [place]: 1026 Representative spoke with at [place]: [employer] ([place]) Interventions     Readmission Risk Interventions Readmission Risk Prevention Plan 04/20/2019  Transportation Screening Complete  PCP or Specialist Appt within 5-7 Days Complete  Home Care Screening Complete  Medication Review (RN CM) Complete  Some recent data might be hidden

## 2019-04-20 NOTE — Care Management Obs Status (Signed)
[facility] NOTIFICATION   Patient Details  Name: Yolanda Casey MRN: [id number] Date of Birth: [birth year]   Medicare Observation Status Notification Given:  Yes  Signed by patient. Pt with noted confusion at time. Will call daughter to discuss letter as well.  Valente Fosberg A Samar Venneman, RN 04/20/2019, 11:02 AM

## 2019-04-20 NOTE — Progress Notes (Signed)
Pt is being discharged home.  Discharge papers given and explained to pt's daughter, Andi Devon, verbalized understanding.  Meds and f/u appointments reviewed.  Rx given.

## 2019-04-29 ENCOUNTER — Inpatient Hospital Stay
Admission: EM | Admit: 2019-04-29 | Discharge: 2019-05-06 | DRG: 101 | Disposition: A | Discharge status: Hospice | Payer: Medicare Other | Attending: Family Medicine | Admitting: Family Medicine

## 2019-04-29 ENCOUNTER — Other Ambulatory Visit: Payer: Self-pay

## 2019-04-29 DIAGNOSIS — G40909 Epilepsy, unspecified, not intractable, without status epilepticus: Secondary | ICD-10-CM | POA: Diagnosis present

## 2019-04-29 DIAGNOSIS — Z880 Allergy status to penicillin: Secondary | ICD-10-CM

## 2019-04-29 DIAGNOSIS — Z881 Allergy status to other antibiotic agents status: Secondary | ICD-10-CM

## 2019-04-29 DIAGNOSIS — Z515 Encounter for palliative care: Secondary | ICD-10-CM

## 2019-04-29 DIAGNOSIS — R569 Unspecified convulsions: Secondary | ICD-10-CM | POA: Diagnosis present

## 2019-04-29 DIAGNOSIS — Z20828 Contact with and (suspected) exposure to other viral communicable diseases: Secondary | ICD-10-CM | POA: Diagnosis present

## 2019-04-29 DIAGNOSIS — Z91040 Latex allergy status: Secondary | ICD-10-CM | POA: Diagnosis not present

## 2019-04-29 DIAGNOSIS — Z885 Allergy status to narcotic agent status: Secondary | ICD-10-CM

## 2019-04-29 DIAGNOSIS — Z7982 Long term (current) use of aspirin: Secondary | ICD-10-CM | POA: Diagnosis not present

## 2019-04-29 DIAGNOSIS — Z933 Colostomy status: Secondary | ICD-10-CM | POA: Diagnosis not present

## 2019-04-29 DIAGNOSIS — Z66 Do not resuscitate: Secondary | ICD-10-CM | POA: Diagnosis present

## 2019-04-29 DIAGNOSIS — I2510 Atherosclerotic heart disease of native coronary artery without angina pectoris: Secondary | ICD-10-CM | POA: Diagnosis present

## 2019-04-29 DIAGNOSIS — F0390 Unspecified dementia without behavioral disturbance: Secondary | ICD-10-CM | POA: Diagnosis present

## 2019-04-29 DIAGNOSIS — Z79899 Other long term (current) drug therapy: Secondary | ICD-10-CM

## 2019-04-29 DIAGNOSIS — F329 Major depressive disorder, single episode, unspecified: Secondary | ICD-10-CM | POA: Diagnosis present

## 2019-04-29 DIAGNOSIS — Z8249 Family history of ischemic heart disease and other diseases of the circulatory system: Secondary | ICD-10-CM | POA: Diagnosis not present

## 2019-04-29 DIAGNOSIS — Z91048 Other nonmedicinal substance allergy status: Secondary | ICD-10-CM | POA: Diagnosis not present

## 2019-04-29 DIAGNOSIS — I10 Essential (primary) hypertension: Secondary | ICD-10-CM | POA: Diagnosis present

## 2019-04-29 DIAGNOSIS — Z90710 Acquired absence of both cervix and uterus: Secondary | ICD-10-CM | POA: Diagnosis not present

## 2019-04-29 DIAGNOSIS — Z888 Allergy status to other drugs, medicaments and biological substances status: Secondary | ICD-10-CM | POA: Diagnosis not present

## 2019-04-29 DIAGNOSIS — E785 Hyperlipidemia, unspecified: Secondary | ICD-10-CM | POA: Diagnosis present

## 2019-04-29 DIAGNOSIS — Z8673 Personal history of transient ischemic attack (TIA), and cerebral infarction without residual deficits: Secondary | ICD-10-CM

## 2019-04-29 LAB — CBC WITH DIFFERENTIAL/PLATELET
Abs Immature Granulocytes: 0.02 10*3/uL (ref 0.00–0.07)
Basophils Absolute: 0 10*3/uL (ref 0.0–0.1)
Basophils Relative: 0 %
Eosinophils Absolute: 0.2 10*3/uL (ref 0.0–0.5)
Eosinophils Relative: 3 %
HCT: 41.3 % (ref 36.0–46.0)
Hemoglobin: 13.7 g/dL (ref 12.0–15.0)
Immature Granulocytes: 0 %
Lymphocytes Relative: 47 %
Lymphs Abs: 3.8 10*3/uL (ref 0.7–4.0)
MCH: 33.8 pg (ref 26.0–34.0)
MCHC: 33.2 g/dL (ref 30.0–36.0)
MCV: 102 fL — ABNORMAL HIGH (ref 80.0–100.0)
Monocytes Absolute: 0.9 10*3/uL (ref 0.1–1.0)
Monocytes Relative: 11 %
Neutro Abs: 3.1 10*3/uL (ref 1.7–7.7)
Neutrophils Relative %: 39 %
Platelets: 211 10*3/uL (ref 150–400)
RBC: 4.05 MIL/uL (ref 3.87–5.11)
RDW: 13.4 % (ref 11.5–15.5)
WBC: 8.1 10*3/uL (ref 4.0–10.5)
nRBC: 0 % (ref 0.0–0.2)

## 2019-04-29 LAB — URINALYSIS, COMPLETE (UACMP) WITH MICROSCOPIC
Bacteria, UA: NONE SEEN
Bilirubin Urine: NEGATIVE
Glucose, UA: NEGATIVE mg/dL
Hgb urine dipstick: NEGATIVE
Ketones, ur: NEGATIVE mg/dL
Nitrite: NEGATIVE
Protein, ur: NEGATIVE mg/dL
Specific Gravity, Urine: 1.011 (ref 1.005–1.030)
pH: 6 (ref 5.0–8.0)

## 2019-04-29 LAB — COMPREHENSIVE METABOLIC PANEL
ALT: 32 U/L (ref 0–44)
AST: 25 U/L (ref 15–41)
Albumin: 4.1 g/dL (ref 3.5–5.0)
Alkaline Phosphatase: 73 U/L (ref 38–126)
Anion gap: 12 (ref 5–15)
BUN: 20 mg/dL (ref 8–23)
CO2: 22 mmol/L (ref 22–32)
Calcium: 9.6 mg/dL (ref 8.9–10.3)
Chloride: 105 mmol/L (ref 98–111)
Creatinine, Ser: 0.68 mg/dL (ref 0.44–1.00)
GFR calc Af Amer: >60 mL/min (ref >60)
GFR calc non Af Amer: >60 mL/min (ref >60)
Glucose, Bld: 98 mg/dL (ref 70–99)
Potassium: 3.8 mmol/L (ref 3.5–5.1)
Sodium: 139 mmol/L (ref 135–145)
Total Bilirubin: 0.5 mg/dL (ref 0.3–1.2)
Total Protein: 7.4 g/dL (ref 6.5–8.1)

## 2019-04-29 LAB — URINE DRUG SCREEN, QUALITATIVE (ARMC ONLY)
Amphetamines, Ur Screen: NOT DETECTED
Barbiturates, Ur Screen: NOT DETECTED
Benzodiazepine, Ur Scrn: NOT DETECTED
Cannabinoid 50 Ng, Ur ~~LOC~~: NOT DETECTED
Cocaine Metabolite,Ur ~~LOC~~: NOT DETECTED
MDMA (Ecstasy)Ur Screen: NOT DETECTED
Methadone Scn, Ur: NOT DETECTED
Opiate, Ur Screen: NOT DETECTED
Phencyclidine (PCP) Ur S: NOT DETECTED
Tricyclic, Ur Screen: NOT DETECTED

## 2019-04-29 MED ORDER — LEVETIRACETAM IN NACL 500 MG/100ML IV SOLN
500.0000 mg | Freq: Two times a day (BID) | INTRAVENOUS | Status: DC
  Administered 2019-04-29 – 2019-05-01 (×4): 500 mg via INTRAVENOUS
  Filled 2019-04-29 (×6): qty 100

## 2019-04-29 MED ORDER — ACETAMINOPHEN 325 MG PO TABS
650.0000 mg | ORAL_TABLET | Freq: Four times a day (QID) | ORAL | Status: DC | PRN
  Administered 2019-05-05: 650 mg via ORAL
  Filled 2019-04-29: qty 2

## 2019-04-29 MED ORDER — ACETAMINOPHEN 650 MG RE SUPP
650.0000 mg | Freq: Four times a day (QID) | RECTAL | Status: DC | PRN
  Administered 2019-05-03 – 2019-05-06 (×5): 650 mg via RECTAL
  Filled 2019-04-29 (×7): qty 1

## 2019-04-29 MED ORDER — LORAZEPAM 2 MG/ML IJ SOLN
1.0000 mg | Freq: Once | INTRAMUSCULAR | Status: AC
  Administered 2019-04-29: 1 mg via INTRAVENOUS

## 2019-04-29 MED ORDER — METOPROLOL SUCCINATE ER 25 MG PO TB24
12.5000 mg | ORAL_TABLET | Freq: Every day | ORAL | Status: DC
  Administered 2019-04-30 – 2019-05-01 (×2): 12.5 mg via ORAL
  Filled 2019-04-29 (×3): qty 1

## 2019-04-29 MED ORDER — LEVETIRACETAM IN NACL 500 MG/100ML IV SOLN
500.0000 mg | Freq: Once | INTRAVENOUS | Status: AC
  Administered 2019-04-29: 500 mg via INTRAVENOUS
  Filled 2019-04-29: qty 100

## 2019-04-29 MED ORDER — DULOXETINE HCL 20 MG PO CPEP
40.0000 mg | ORAL_CAPSULE | Freq: Every day | ORAL | Status: DC
  Administered 2019-04-30 – 2019-05-01 (×2): 40 mg via ORAL
  Filled 2019-04-29 (×5): qty 2

## 2019-04-29 MED ORDER — QUETIAPINE FUMARATE 25 MG PO TABS
50.0000 mg | ORAL_TABLET | Freq: Every day | ORAL | Status: DC
  Administered 2019-04-29 – 2019-04-30 (×2): 50 mg via ORAL
  Filled 2019-04-29 (×2): qty 2

## 2019-04-29 MED ORDER — ASPIRIN EC 81 MG PO TBEC
81.0000 mg | DELAYED_RELEASE_TABLET | Freq: Every day | ORAL | Status: DC
  Administered 2019-04-30 – 2019-05-01 (×2): 81 mg via ORAL
  Filled 2019-04-29 (×3): qty 1

## 2019-04-29 MED ORDER — ENOXAPARIN SODIUM 40 MG/0.4ML ~~LOC~~ SOLN
40.0000 mg | SUBCUTANEOUS | Status: DC
  Administered 2019-04-29 – 2019-05-05 (×6): 40 mg via SUBCUTANEOUS
  Filled 2019-04-29 (×6): qty 0.4

## 2019-04-29 MED ORDER — LORAZEPAM 2 MG/ML IJ SOLN
INTRAMUSCULAR | Status: AC
  Filled 2019-04-29: qty 1

## 2019-04-29 MED ORDER — ONDANSETRON HCL 4 MG/2ML IJ SOLN
4.0000 mg | Freq: Four times a day (QID) | INTRAMUSCULAR | Status: DC | PRN
  Administered 2019-05-03 – 2019-05-05 (×2): 4 mg via INTRAVENOUS
  Filled 2019-04-29 (×2): qty 2

## 2019-04-29 MED ORDER — RAMIPRIL 5 MG PO CAPS
5.0000 mg | ORAL_CAPSULE | Freq: Every day | ORAL | Status: DC
  Administered 2019-04-30 – 2019-05-01 (×2): 5 mg via ORAL
  Filled 2019-04-29 (×5): qty 1

## 2019-04-29 MED ORDER — HALOPERIDOL LACTATE 5 MG/ML IJ SOLN
2.5000 mg | Freq: Four times a day (QID) | INTRAMUSCULAR | Status: DC | PRN
  Administered 2019-04-29 – 2019-05-01 (×2): 2.5 mg via INTRAVENOUS
  Filled 2019-04-29 (×2): qty 1

## 2019-04-29 MED ORDER — ONDANSETRON HCL 4 MG PO TABS: 4.0000 mg | ORAL_TABLET | Freq: Four times a day (QID) | ORAL | Status: DC | PRN

## 2019-04-29 NOTE — ED Notes (Signed)
Red tube sent to lab as requested.

## 2019-04-29 NOTE — ED Notes (Signed)
Attempted report to floor. Will call back in 15mins.  

## 2019-04-29 NOTE — ED Notes (Signed)
Pt acting more herself but getting a little "feisty" per family. Pt verbal and actively playing with the pulse ox cord. Pt calm with this RN.

## 2019-04-29 NOTE — ED Notes (Signed)
Pharm called to send [place] STAT as pt seizing again. Pt placed on 3L oxygen via [place].

## 2019-04-29 NOTE — ED Notes (Signed)
Pt calm with this RN. Denies pain or any needs. Pt had pulled off BP cuff and [place]. Replaced on pt's arm and face.

## 2019-04-29 NOTE — ED Notes (Signed)
Pt's briefs wet with urine. Changed out of briefs.

## 2019-04-29 NOTE — ED Notes (Signed)
EDP Yolanda Casey wasnts pt to remains on A&C precautions for now. Per Brittney NT, pt has become active again. This RN to bedside to assess.

## 2019-04-29 NOTE — ED Notes (Addendum)
Dr. Cinda Quest at bedside, seizure pads in place

## 2019-04-29 NOTE — ED Notes (Addendum)
Pt attempted to crawl out of bed. Pulled off [place]. Desat to 91-94% RA. [place] placed on pt; remains on 2L. Brittney NT notified this RN. This RN went to bedside and assisted NT to reposition pt. Reoriented pt. Safety mats placed all around pt's bed. Lights dimmed some. Door remains open. Bed locked low. Rails up. Pt relaxing again.

## 2019-04-29 NOTE — ED Notes (Addendum)
Family at bedside states current A&Ox2 is pt's baseline since dementia. Pt states this was first seizure in years. Family at bedside states she had 5 today with a brief pause in between each one. Family states pt was just started on Keppra a week ago. Pt alert and calmly laying in bed resting. Call bell within reach. Bed locked low. Rails up with pads. Colostomy clean, dry, intact. Family denies pt having fevers or any issues with colostomy.

## 2019-04-29 NOTE — ED Notes (Addendum)
Pt restless per family, trying to crawl out of bed, pt has removed pulse ox cord. Family requesting pt given meds first and hold off on mittons unless the meds don't work. Pt remains on 2L oxygen.

## 2019-04-29 NOTE — ED Notes (Addendum)
Pt's family leaving bedside. Checked pt's briefs; dry. Pt calming down. Door left open. Bed locked low. Call bell within reach. Rails up. This RN, Brittney NT, and Animal nutritionist monitoring pt from right outside room.

## 2019-04-29 NOTE — ED Notes (Signed)
Pt stopped seizing after after a minute. Pt verbal now. Still anxious. Family comforting pt.

## 2019-04-29 NOTE — ED Provider Notes (Signed)
[facility] Emergency Department Provider Note   ____________________________________________   First MD Initiated Contact with Patient 04/29/19 1526     (approximate)  I have reviewed the triage vital signs and the nursing notes.   HISTORY  Chief Complaint Seizures History limited by dementia and or altered mental status.  HPI Yolanda HIGGINBOTHAM is a 85 y.o. female who is I understand he comes from home.  She has had 5 episodes today close together where she has contraction of the arms and diffuse shaking.  I am uncertain whether or not she woke up in between these.  She had a similar episode 10 days ago after which she had a head CT angiogram MRI and was supposed of had an EEG although cannot find the results of that.  The radiology tests were all negative.  Patient is now approaching her baseline.  She says this happened after her son left.  She cannot give me any more details currently.      Past Medical History:  Diagnosis Date  . Coronary artery disease   . Dementia (HCC)   . High cholesterol   . Hypertension   . Stroke [facility])     Patient Active Problem List   Diagnosis Date Noted  . HTN (hypertension) 04/19/2019  . HLD (hyperlipidemia) 04/19/2019  . Dementia (HCC) 04/19/2019  . CAD (coronary artery disease) 04/19/2019  . Seizure (HCC) 04/19/2019  . Lumbar degenerative disc disease 12/29/2016  . Spinal stenosis 12/29/2016  . Pyuria, sterile 12/29/2016  . Hyperglycemia 12/29/2016  . Generalized weakness 12/29/2016  . Back pain 12/28/2016    Past Surgical History:  Procedure Laterality Date  . ABDOMINAL HYSTERECTOMY    . CARDIAC SURGERY    . none      Prior to Admission medications   Medication Sig Start Date End Date Taking? Authorizing Provider  acetaminophen (TYLENOL) 500 MG tablet Take 1,000 mg by mouth 3 (three) times daily.  11/27/08   [provider]  aspirin EC 81 MG tablet Take 1 tablet by mouth daily. 11/27/08    [provider]  DULoxetine (CYMBALTA) 20 MG capsule Take 40 mg by mouth daily.    [provider]  levETIRAcetam (KEPPRA) 250 MG tablet Take 1 tablet (250 mg total) by mouth 2 (two) times daily. 04/20/19   Adrian Saran, MD  QUEtiapine (SEROQUEL) 50 MG tablet Take 50 mg by mouth at bedtime.    [provider]  ramipril (ALTACE) 5 MG capsule Take 1 capsule by mouth daily. 12/16/16   [provider]  TOPROL XL 25 MG 24 hr tablet Take 0.5 tablets by mouth daily. 11/10/16   [provider]    Allergies Omeprazole, Penicillins, Atenolol, Levofloxacin, Adhesive [tape], Albuterol, Atorvastatin, Ciprofloxacin, Codeine, Diclofenac, Fluoxetine, Indomethacin, Latex, Losartan, Oxybutynin chloride, Oxycodone-acetaminophen, Seroquel [quetiapine fumarate], Sucralfate, Tramadol, and Gabapentin  Family History  Problem Relation Age of Onset  . Renal Disease Mother   . Heart failure Father     Social History Social History   Tobacco Use  . Smoking status: Never Smoker  . Smokeless tobacco: Never Used  Substance Use Topics  . Alcohol use: No  . Drug use: No    Review of Systems  Unable to obtain   ____________________________________________   PHYSICAL EXAM:  VITAL SIGNS: ED Triage Vitals  Enc Vitals Group     BP 04/29/19 1530 (!) 152/89     Pulse Rate 04/29/19 1531 73     Resp 04/29/19 1530 19  Temp 04/29/19 1532 97.8 F (36.6 C)     Temp Source 04/29/19 1532 Oral     SpO2 04/29/19 1531 100 %     Weight 04/29/19 1529 145 lb (65.8 kg)     Height 04/29/19 1529 5\' 3"  (1.6 m)     Head Circumference --      Peak Flow --      Pain Score 04/29/19 1528 0     Pain Loc --      Pain Edu? --      Excl. in [place]? --     Constitutional: Alert but somewhat confused.  Has trouble following commands.  Very difficult to get a history even with repeating the questions multiple times.. Eyes: Conjunctivae are normal. PERRL. EOMI. Head: Atraumatic. Nose:  No congestion/rhinnorhea. Mouth/Throat: Mucous membranes are moist.  Oropharynx non-erythematous. Neck: No stridor. Cardiovascular: Normal rate, regular rhythm. Grossly normal heart sounds.  Good peripheral circulation. Respiratory: Normal respiratory effort.  No retractions. Lungs CTAB. Gastrointestinal: Soft and nontender. No distention. No abdominal bruits. No CVA tenderness. Musculoskeletal: No lower extremity tenderness nor edema.  No joint effusions. Neurologic: Speech appears to be normal although she has frequent pauses.  Cranial nerves II through XII appear to be intact the could not check visual fields.  Motor strength is 5/5 throughout.  Patient does have a resting tremor.  It continues when I do finger-to-nose.  It does not worsen.  I do not find any focal motor weakness and patient does not report any numbness. Skin:  Skin is warm, dry and intact. No rash noted. .  ____________________________________________   LABS (all labs ordered are listed, but only abnormal results are displayed)  Labs Reviewed  LEVETIRACETAM LEVEL   ____________________________________________  EKG  EKG read and interpreted by me shows sinus rhythm at 82 left axis very poor R wave progression with some irregularity of the baseline.  No acute ST-T changes were seen. ____________________________________________  [place]  ED MD interpretation:   Official radiology report(s): No results found.  ____________________________________________   PROCEDURES  Procedure(s) performed (including Critical Care):  Procedures   ____________________________________________   INITIAL IMPRESSION / ASSESSMENT AND PLAN / ED COURSE  Discussed patient with Dr. Doy Mince neurology.  She recommends we admit this patient if she patient had multiple seizures.     HOLLIS OH was evaluated in Emergency Department on 04/29/2019 for the symptoms described in the history of present illness. She was  evaluated in the context of the global COVID-19 pandemic, which necessitated consideration that the patient might be at risk for infection with the SARS-CoV-2 virus that causes COVID-19. Institutional protocols and algorithms that pertain to the evaluation of patients at risk for COVID-19 are in a state of rapid change based on information released by regulatory bodies including the CDC and federal and state organizations. These policies and algorithms were followed during the patient's care in the ED.         ____________________________________________   FINAL CLINICAL IMPRESSION(S) / ED DIAGNOSES  Final diagnoses:  Seizure [facility])     ED Discharge Orders    None       Note:  This document was prepared using Dragon voice recognition software and may include unintentional dictation errors.    Nena Polio, MD 04/29/19 [phone]

## 2019-04-29 NOTE — ED Notes (Signed)
Pt asleep. Bed locked low. Call bell within reach. Rails up. Door open.

## 2019-04-29 NOTE — ED Notes (Signed)
Lav/grn tubes sent to lab.  

## 2019-04-29 NOTE — ED Notes (Signed)
Pt resting calmly in bed. Family remains at bedside.

## 2019-04-29 NOTE — H&P (Signed)
[place] [facility] [place] NAME: Yolanda Casey    MR#:  [id number]  DATE OF BIRTH:  12-26-1925  DATE OF ADMISSION:  04/29/2019  PRIMARY CARE PHYSICIAN: Hortencia Pilar, MD   REQUESTING/REFERRING PHYSICIAN: Dr. Conni Slipper  CHIEF COMPLAINT:   Chief Complaint  Patient presents with  . Seizures    HISTORY OF PRESENT ILLNESS:  Yolanda Casey  is a 85 y.o. female with a known history of dementia, hypertension, hyperlipidemia, history of previous CVA, coronary artery disease, recent admission for seizures who presents to the hospital due to recurrent seizures.  Patient herself cannot give a good history therefore most of the history obtained from the daughter at bedside.  As per the daughter she noticed patient having recurrent back-to-back seizures today this afternoon around 4 PM.  She describes the episodes as her upper extremities shaking with her right upper extremity getting contracted up to her face, she had some minimal lower extremity shaking and each episode lasted about 30 seconds to a minute and then happened back to back and therefore she brought her mom to the ER for further evaluation.  Patient had another small episode here in the ER and was given IV Ativan and then also given IV Keppra.  ER physician spoke to neurologist Dr. Doy Mince who recommended admission for further evaluation.  PAST MEDICAL HISTORY:   Past Medical History:  Diagnosis Date  . Coronary artery disease   . Dementia ([place])   . High cholesterol   . Hypertension   . Stroke [facility])     PAST SURGICAL HISTORY:   Past Surgical History:  Procedure Laterality Date  . ABDOMINAL HYSTERECTOMY    . CARDIAC SURGERY    . none      SOCIAL HISTORY:   Social History   Tobacco Use  . Smoking status: Never Smoker  . Smokeless tobacco: Never Used  Substance Use Topics  . Alcohol use: No    FAMILY HISTORY:   Family History  Problem Relation Age of Onset  . Renal  Disease Mother   . Heart failure Father     DRUG ALLERGIES:   Allergies  Allergen Reactions  . Omeprazole Other (See Comments)    Other Reaction: gi upset  . Penicillins Itching and Other (See Comments)    Patient was adamant that he has a "strong allergy to penicillins" Did it involve swelling of the face/tongue/throat, SOB, or low BP? Unknown Did it involve sudden or severe rash/hives, skin peeling, or any reaction on the inside of your mouth or nose? Unknown Did you need to seek medical attention at a hospital or doctor's office? Unknown When did it last happen?unknown If all above answers are "NO", may proceed with cephalosporin use.   . Atenolol Other (See Comments)    Other Reaction: DROP IN BP  . Levofloxacin Diarrhea  . Adhesive [Tape] Other (See Comments)    Reaction: unknown  . Albuterol Other (See Comments)    Reaction: unknown  . Atorvastatin Other (See Comments)    Reaction: unknown  . Ciprofloxacin Other (See Comments)    Reaction: unknown  . Codeine Other (See Comments)    Reaction: unknown  . Diclofenac Other (See Comments)    Reaction: unknown  . Fluoxetine Other (See Comments)    Reaction: unknown  . Indomethacin Other (See Comments)    Reaction: unknown  . Latex Other (See Comments)    Reaction: unknown  . Losartan Other (See Comments)  Reaction: unknown  . Oxybutynin Chloride Other (See Comments)    Reaction: unknown  . Oxycodone-Acetaminophen Other (See Comments)    Reaction: unknown  . Seroquel [Quetiapine Fumarate] Other (See Comments)    Dystonic reaction  . Sucralfate Other (See Comments)    Reaction: unknown  . Tramadol Diarrhea    Pt and daughter report it gives her diarrhea. Usually probiotics are given to her to help per daughter.   . Gabapentin Nausea Only    REVIEW OF SYSTEMS:   Review of Systems  Unable to perform ROS: Dementia    MEDICATIONS AT HOME:   Prior to Admission medications   Medication Sig Start Date End Date  Taking? Authorizing Provider  acetaminophen (TYLENOL) 500 MG tablet Take 1,000 mg by mouth 3 (three) times daily.  11/27/08   [provider]  aspirin EC 81 MG tablet Take 1 tablet by mouth daily. 11/27/08   [provider]  DULoxetine (CYMBALTA) 20 MG capsule Take 40 mg by mouth daily.    [provider]  levETIRAcetam (KEPPRA) 250 MG tablet Take 1 tablet (250 mg total) by mouth 2 (two) times daily. 04/20/19   Adrian SaranMody, Sital, MD  QUEtiapine (SEROQUEL) 50 MG tablet Take 50 mg by mouth at bedtime.    [provider]  ramipril (ALTACE) 5 MG capsule Take 1 capsule by mouth daily. 12/16/16   [provider]  TOPROL XL 25 MG 24 hr tablet Take 0.5 tablets by mouth daily. 11/10/16   [provider]      VITAL SIGNS:  Blood pressure (!) 128/96, pulse 75, temperature 97.8 F (36.6 C), temperature source Oral, resp. rate 18, height 5\' 3"  (1.6 m), weight 65.8 kg, SpO2 100 %.  PHYSICAL EXAMINATION:  Physical Exam  GENERAL:  85 y.o.-year-old patient lying in the bed in no acute distress.  EYES: Pupils equal, round, reactive to light and accommodation. No scleral icterus. Extraocular muscles intact.  HEENT: Head atraumatic, normocephalic. Oropharynx and nasopharynx clear. No oropharyngeal erythema, moist oral mucosa  NECK:  Supple, no jugular venous distention. No thyroid enlargement, no tenderness.  LUNGS: Normal breath sounds bilaterally, no wheezing, rales, rhonchi. No use of accessory muscles of respiration.  CARDIOVASCULAR: S1, S2 RRR. No murmurs, rubs, gallops, clicks.  ABDOMEN: Soft, nontender, nondistended. Bowel sounds present. No organomegaly or mass.  EXTREMITIES: No pedal edema, cyanosis, or clubbing. + 2 pedal & radial pulses b/l.   NEUROLOGIC: Cranial nerves II through XII are intact. No focal Motor or sensory deficits appreciated b/l. Globally weak.  PSYCHIATRIC: The patient is alert and oriented x 1.  SKIN: No obvious rash, lesion, or  ulcer.   LABORATORY PANEL:   CBC Recent Labs  Lab 04/29/19 1637  WBC 8.1  HGB 13.7  HCT 41.3  PLT 211   ------------------------------------------------------------------------------------------------------------------  Chemistries  Recent Labs  Lab 04/29/19 1637  NA 139  K 3.8  CL 105  CO2 22  GLUCOSE 98  BUN 20  CREATININE 0.68  CALCIUM 9.6  AST 25  ALT 32  ALKPHOS 73  BILITOT 0.5   ------------------------------------------------------------------------------------------------------------------  Cardiac Enzymes No results for input(s): TROPONINI in the last 168 hours. ------------------------------------------------------------------------------------------------------------------  RADIOLOGY:  No results found.   IMPRESSION AND PLAN:   85 y.o. female with a known history of dementia, hypertension, hyperlipidemia, history of previous CVA, coronary artery disease, recent admission for seizures who presents to the hospital due to recurrent seizures.  1.  Recurrent seizures-patient was recently hospitalized and thought to have encephalopathy  secondary to seizure and discharged on low-dose Keppra.  She had been doing well until today she developed multiple episodes of seizures. -ER physician discussed with neurology who recommended admission for further management. -Patient has been loaded with IV Keppra and will increase the patient's dose of Keppra to 500 IV twice daily. -Continue seizure precautions, will get EEG.  We will also get a neurology consult.  2.  History of dementia-patient is high risk for sundowning. -Continue Seroquel, will add some as needed IV Haldol for agitation.  3.  Essential hypertension-continue Toprol, ramipril.  4.  Depression-continue Cymbalta.    All the records are reviewed and case discussed with ED provider. Management plans discussed with the patient, family and they are in agreement.  CODE STATUS: DNR  TOTAL TIME TAKING  CARE OF THIS PATIENT: 40 minutes.    Houston Siren M.D on 04/29/2019 at 5:57 PM  Between 7am to 6pm - Pager - [phone]  After 6pm go to [email or web address] - password [email or web address] [facility]  [place]  [facility]  Office  [phone]  CC: Primary care physician; Rolm Gala, MD

## 2019-04-29 NOTE — ED Notes (Signed)
Seizure pads on bedrails

## 2019-04-29 NOTE — ED Notes (Addendum)
Attempted I&O cath with Vet NT assisting. Pt tolerated well, however, pt dry. Unable to collect sample. Pt's briefs from home were full of urine. Peri care provided. Briefs changed. Bed locked low. Rails up. Call bell within reach. Family remains at bedside. IV wrapped with coban as family states pt sundowns. Admitting provider at bedside - aware of this.

## 2019-04-29 NOTE — ED Triage Notes (Signed)
Pt arrives via [facility] from home for prolonged seizure activity per daughter. Pt had 5 one minute long seizures back to back, reports she contracts her arms up to her chest and shakes but it does not result in a grand mal seizure. Pt A&Ox4 and in NAD. Pt has a colostomy from a past prolapsed bowel.

## 2019-04-29 NOTE — ED Notes (Signed)
Pt had another 30 sec seizure in bed per family. Pt acting post-ictal. EDP Malinda notified.

## 2019-04-29 NOTE — ED Notes (Signed)
Pt repositioned in bed. Calm. Alert.

## 2019-04-29 NOTE — ED Notes (Signed)
Family states pt history of incontinence. Will complete I&O cath soon.

## 2019-04-29 NOTE — ED Notes (Signed)
This RN to room with Brittney NT to reposition pt and reorient/educate as pt has R leg hanging over rail and has turned self sideways in bed. Took [place] off again; reapplied to face and prompted to take deep breaths through nose.

## 2019-04-29 NOTE — ED Notes (Signed)
Family member at bedside visiting.

## 2019-04-29 NOTE — ED Notes (Signed)
EKG completed

## 2019-04-30 ENCOUNTER — Other Ambulatory Visit: Payer: Medicare Other

## 2019-04-30 DIAGNOSIS — R569 Unspecified convulsions: Secondary | ICD-10-CM | POA: Diagnosis not present

## 2019-04-30 LAB — CBC
HCT: 41 % (ref 36.0–46.0)
Hemoglobin: 13.5 g/dL (ref 12.0–15.0)
MCH: 33.8 pg (ref 26.0–34.0)
MCHC: 32.9 g/dL (ref 30.0–36.0)
MCV: 102.8 fL — ABNORMAL HIGH (ref 80.0–100.0)
Platelets: 207 10*3/uL (ref 150–400)
RBC: 3.99 MIL/uL (ref 3.87–5.11)
RDW: 13.2 % (ref 11.5–15.5)
WBC: 7.6 10*3/uL (ref 4.0–10.5)
nRBC: 0 % (ref 0.0–0.2)

## 2019-04-30 LAB — BASIC METABOLIC PANEL
Anion gap: 10 (ref 5–15)
BUN: 17 mg/dL (ref 8–23)
CO2: 24 mmol/L (ref 22–32)
Calcium: 9.2 mg/dL (ref 8.9–10.3)
Chloride: 107 mmol/L (ref 98–111)
Creatinine, Ser: 0.65 mg/dL (ref 0.44–1.00)
GFR calc Af Amer: >60 mL/min (ref >60)
GFR calc non Af Amer: >60 mL/min (ref >60)
Glucose, Bld: 103 mg/dL — ABNORMAL HIGH (ref 70–99)
Potassium: 3.2 mmol/L — ABNORMAL LOW (ref 3.5–5.1)
Sodium: 141 mmol/L (ref 135–145)

## 2019-04-30 LAB — MAGNESIUM: Magnesium: 2.2 mg/dL (ref 1.7–2.4)

## 2019-04-30 LAB — SARS CORONAVIRUS 2 (TAT 6-24 HRS): SARS Coronavirus 2: NEGATIVE

## 2019-04-30 LAB — LEVETIRACETAM LEVEL: Levetiracetam Lvl: 9.5 ug/mL — ABNORMAL LOW (ref 10.0–40.0)

## 2019-04-30 MED ORDER — POTASSIUM CHLORIDE CRYS ER 20 MEQ PO TBCR
40.0000 meq | EXTENDED_RELEASE_TABLET | Freq: Once | ORAL | Status: AC
  Administered 2019-04-30: 08:00:00 40 meq via ORAL
  Filled 2019-04-30: qty 2

## 2019-04-30 MED ORDER — LORAZEPAM 2 MG/ML IJ SOLN
1.0000 mg | INTRAMUSCULAR | Status: DC | PRN
  Administered 2019-05-02 – 2019-05-03 (×4): 2 mg via INTRAVENOUS
  Filled 2019-04-30 (×4): qty 1

## 2019-04-30 MED ORDER — ORAL CARE MOUTH RINSE
15.0000 mL | Freq: Two times a day (BID) | OROMUCOSAL | Status: DC
  Administered 2019-04-30 – 2019-05-06 (×11): 15 mL via OROMUCOSAL

## 2019-04-30 MED ORDER — SODIUM CHLORIDE 0.9 % IV SOLN
75.0000 mL/h | INTRAVENOUS | Status: DC
  Administered 2019-04-30: 75 mL/h via INTRAVENOUS

## 2019-04-30 NOTE — Procedures (Signed)
ELECTROENCEPHALOGRAM REPORT   Patient: Yolanda Casey       Room #: 111A-AA EEG No. ID: [id number] Age: 85 y.o.        Sex: female Referring Physician: Stark Jock Report Date:  04/30/2019        Interpreting Physician: Alexis Goodell  History: ANYELIN MOGLE is an 85 y.o. female with a history of seizures presenting with breakthrough seizures  Medications:  ASA, Keppra, Cymbalta, Seroquel, Altace  Conditions of Recording:  This is a 21 channel routine scalp EEG performed with bipolar and monopolar montages arranged in accordance to the international 10/20 system of electrode placement. One channel was dedicated to EKG recording.  The patient is in the awake and drowsy states.  Description:  The waking background activity can be evaluated briefly and consists of a low voltage, symmetrical, fairly well organized, 7 Hz theta activity, seen from the parieto-occipital and posterior temporal regions.  Low voltage fast activity, poorly organized, is seen anteriorly and is at times superimposed on more posterior regions.  A mixture of theta and alpha rhythms are seen from the central and temporal regions. The patient drowses for the majority of the recording with slowing to irregular, low voltage theta and beta activity.   Stage II sleep is not obtained. No epileptiform activity is noted.   Hyperventilation was not performed.  Intermittent photic stimulation was performed but failed to illicit any change in the tracing.    IMPRESSION: This is an abnormal EEG secondary to posterior background slowing.  This finding may be seen with a diffuse gray matter disturbance that is etiologically nonspecific, but may include a dementia, among other possibilities.  No epileptiform activity is noted.     Alexis Goodell, MD Neurology [phone] 04/30/2019, 3:13 PM

## 2019-04-30 NOTE — TOC Initial Note (Signed)
Transition of Care [facility]) - Initial/Assessment Note    Patient Details  Name: Yolanda Casey MRN: [id number] Date of Birth: [birth year]  Transition of Care [facility]) CM/SW Contact:    Allayne Butcher, RN Phone Number: 04/30/2019, 4:22 PM  Clinical Narrative:                  Patient is from home where she lives with her daughter.  Patient was recently discharged from the hospital with home health services through [facility].  Patient has a history of dementia and seizures.  RNCM spoke with patient's daughter and explained the role of case Production designer, theatre/television/film.  Daughter reports that the patient has been declining at home and she would like for her to be placed in a facility where they can care for her.  PT did recommend SNF.  Daughter prefers [facility] but if they do not have bed availability then [facility] is a second choice.   Pasrr started and pending.  Bed request sent out to [facility] and [facility].   RNCM will follow up with patient's daughter tomorrow.   Expected Discharge Plan: Skilled Nursing Facility Barriers to Discharge: Continued Medical Work up   Patient Goals and CMS Choice   CMS Medicare.gov Compare Post Acute Care list provided to:: Patient Represenative (must comment)(daughter) Choice offered to / list presented to : Adult Children  Expected Discharge Plan and Services Expected Discharge Plan: Skilled Nursing Facility   Discharge Planning Services: CM Consult   Living arrangements for the past 2 months: Single Family Home                                      Prior Living Arrangements/Services Living arrangements for the past 2 months: Single Family Home Lives with:: Adult Children Patient language and need for interpreter reviewed:: No        Need for Family Participation in Patient Care: Yes (Comment)(dementia, seizures) Care giver support system in place?: Yes (comment)(daughter) Current home services: Homehealth aide, Home RN, Home  PT Criminal Activity/Legal Involvement Pertinent to Current Situation/Hospitalization: No - Comment as needed  Activities of Daily Living Home Assistive Devices/Equipment: Oxygen ADL Screening (condition at time of admission) Patient's cognitive ability adequate to safely complete daily activities?: No Is the patient deaf or have difficulty hearing?: No Does the patient have difficulty seeing, even when wearing glasses/contacts?: No Does the patient have difficulty concentrating, remembering, or making decisions?: Yes Patient able to express need for assistance with ADLs?: Yes Does the patient have difficulty dressing or bathing?: Yes Independently performs ADLs?: No Communication: Independent Dressing (OT): Needs assistance Is this a change from baseline?: Pre-admission baseline Grooming: Needs assistance Is this a change from baseline?: Pre-admission baseline Feeding: Independent Bathing: Needs assistance Is this a change from baseline?: Pre-admission baseline Toileting: Needs assistance Is this a change from baseline?: Pre-admission baseline In/Out Bed: Needs assistance Is this a change from baseline?: Pre-admission baseline Walks in Home: Needs assistance Is this a change from baseline?: Pre-admission baseline Does the patient have difficulty walking or climbing stairs?: Yes Weakness of Legs: Both Weakness of Arms/Hands: None  Permission Sought/Granted Permission sought to share information with : Case Manager, Family Supports Permission granted to share information with : Yes, Verbal Permission Granted     Permission granted to share info w AGENCY: Licensed conveyancer granted to share info w Relationship: Daughter  Emotional Assessment Appearance:: Appears stated age     Orientation: : Oriented to Self Alcohol / Substance Use: Not Applicable Psych Involvement: No (comment)  Admission diagnosis:  Seizure [facility]) [R56.9] Patient Active Problem List    Diagnosis Date Noted  . Recurrent seizures ([place]) 04/29/2019  . HTN (hypertension) 04/19/2019  . HLD (hyperlipidemia) 04/19/2019  . Dementia ([place]) 04/19/2019  . CAD (coronary artery disease) 04/19/2019  . Seizure ([place]) 04/19/2019  . Lumbar degenerative disc disease 12/29/2016  . Spinal stenosis 12/29/2016  . Pyuria, sterile 12/29/2016  . Hyperglycemia 12/29/2016  . Generalized weakness 12/29/2016  . Back pain 12/28/2016   PCP:  Hortencia Pilar, MD Pharmacy:   [facility], [place] [address] [place] [address] Phone: [phone] Fax: [phone]  [facility] [phone] [place], [place] - [address] [place] [address] Phone: [phone] Fax: [phone]     Social Determinants of Health (SDOH) Interventions    Readmission Risk Interventions Readmission Risk Prevention Plan 04/20/2019  Transportation Screening Complete  PCP or Specialist Appt within 5-7 Days Complete  Home Care Screening Complete  Medication Review (RN CM) Complete  Some recent data might be hidden

## 2019-04-30 NOTE — Consult Note (Signed)
Reason for Consult:Seizures Referring Physician: Stark Jock  CC: Seizures  HPI: Yolanda Casey is an 85 y.o. female with a history of seizures presenting with breakthrough seizures.  Patient lives at home with daughter who reports that that patient had at least five seizures yesterday.  Patient was brought in for evaluation.  Reports that after she has seizures it will take her a few days to improve but she does not go back to baseline.  Currently is beyond what she can care for at home.    Past Medical History:  Diagnosis Date  . Coronary artery disease   . Dementia ([place])   . High cholesterol   . Hypertension   . Stroke [facility])     Past Surgical History:  Procedure Laterality Date  . ABDOMINAL HYSTERECTOMY    . CARDIAC SURGERY    . none      Family History  Problem Relation Age of Onset  . Renal Disease Mother   . Heart failure Father     Social History:  reports that she has never smoked. She has never used smokeless tobacco. She reports that she does not drink alcohol or use drugs.  Allergies  Allergen Reactions  . Codeine Other (See Comments)    Reaction: unknown  . Omeprazole Other (See Comments)    Other Reaction: gi upset  . Penicillins Itching and Other (See Comments)    Patient was adamant that he has a "strong allergy to penicillins" Did it involve swelling of the face/tongue/throat, SOB, or low BP? Unknown Did it involve sudden or severe rash/hives, skin peeling, or any reaction on the inside of your mouth or nose? Unknown Did you need to seek medical attention at a hospital or doctor's office? Unknown When did it last happen?unknown If all above answers are "NO", may proceed with cephalosporin use.   . Atenolol Other (See Comments)    Other Reaction: DROP IN BP  . Levofloxacin Diarrhea  . Adhesive [Tape] Other (See Comments)    Reaction: unknown  . Albuterol Other (See Comments)    Reaction: unknown  . Atorvastatin Other (See Comments)    Reaction: unknown   . Ciprofloxacin Other (See Comments)    Reaction: unknown  . Diclofenac Other (See Comments)    Reaction: unknown  . Fluoxetine Other (See Comments)    Reaction: unknown  . Indomethacin Other (See Comments)    Reaction: unknown  . Latex Other (See Comments)    Reaction: unknown  . Losartan Other (See Comments)    Reaction: unknown  . Oxybutynin Chloride Other (See Comments)    Reaction: unknown  . Oxycodone-Acetaminophen Other (See Comments)    Reaction: unknown  . Seroquel [Quetiapine Fumarate] Other (See Comments)    Dystonic reaction  . Sucralfate Other (See Comments)    Reaction: unknown  . Tramadol Diarrhea    Pt and daughter report it gives her diarrhea. Usually probiotics are given to her to help per daughter.   . Gabapentin Nausea Only    Medications:  I have reviewed the patient's current medications. Prior to Admission:  Medications Prior to Admission  Medication Sig Dispense Refill Last Dose  . acetaminophen (TYLENOL) 500 MG tablet Take 1,000 mg by mouth 3 (three) times daily.    04/29/2019 at 0900  . aspirin EC 81 MG tablet Take 1 tablet by mouth daily.   04/29/2019 at 0900  . DULoxetine (CYMBALTA) 20 MG capsule Take 40 mg by mouth 2 (two) times daily.    04/29/2019  at 0900  . levETIRAcetam (KEPPRA) 250 MG tablet Take 1 tablet (250 mg total) by mouth 2 (two) times daily. 60 tablet 0 04/29/2019 at 0900  . QUEtiapine (SEROQUEL) 50 MG tablet Take 50 mg by mouth at bedtime.   04/28/2019 at 2000  . ramipril (ALTACE) 5 MG capsule Take 1 capsule by mouth daily.   04/29/2019 at 0900  . TOPROL XL 25 MG 24 hr tablet Take 0.5 tablets by mouth daily.   04/29/2019 at 0900   Scheduled: . aspirin EC  81 mg Oral Daily  . DULoxetine  40 mg Oral Daily  . enoxaparin (LOVENOX) injection  40 mg Subcutaneous Q24H  . mouth rinse  15 mL Mouth Rinse BID  . metoprolol succinate  12.5 mg Oral Daily  . QUEtiapine  50 mg Oral QHS  . ramipril  5 mg Oral Daily    ROS: Speech minimal.   Patient does not respond to questioning.    Physical Examination: Blood pressure (!) 148/65, pulse 72, temperature 97.7 F (36.5 C), temperature source Oral, resp. rate 17, height  (1.6 m), weight 65.8 kg, SpO2 100 %.  HEENT-  Normocephalic, no lesions, without obvious abnormality.  Normal external eye and conjunctiva.  Normal TM's bilaterally.  Normal auditory canals and external ears. Normal external nose, mucus membranes and septum.  Normal pharynx. Cardiovascular- S1, S2 normal, pulses palpable throughout   Lungs- chest clear, no wheezing, rales, normal symmetric air entry Abdomen- soft, non-tender; bowel sounds normal; no masses,  no organomegaly Extremities- no edema Lymph-no adenopathy palpable Musculoskeletal-no joint tenderness, deformity or swelling Skin-warm and dry, no hyperpigmentation, vitiligo, or suspicious lesions  Neurological Examination   Mental Status: Alert.  When asked the year, she reports it is 22.  When asked the month she reports Halloween.  Speech minimal.  Able to follow 3 step commands without difficulty but requires extra time. Cranial Nerves: II: Discs flat bilaterally; Visual fields grossly normal, pupils equal, round, reactive to light and accommodation III,IV, VI: ptosis not present, extra-ocular motions intact bilaterally V,VII: smile symmetric, facial light touch sensation normal bilaterally VIII: hearing normal bilaterally IX,X: gag reflex present XI: bilateral shoulder shrug XII: midline tongue extension Motor: Moves all extremities against gravity with no focal weakness appreciated Sensory: Light touch intact throughout, bilaterally Deep Tendon Reflexes: Symmetric throughout Plantars: Right: mute   Left: mute Cerebellar: Normal finger-to-nose and normal heel-to-shin testing bilaterally with mild tremor noted in the BUE's Gait: not tested due to safety concerns    Laboratory Studies:   Basic Metabolic Panel: Recent Labs  Lab  04/29/19 1637 04/30/19 0415  NA 139 141  K 3.8 3.2*  CL 105 107  CO2 22 24  GLUCOSE 98 103*  BUN 20 17  CREATININE 0.68 0.65  CALCIUM 9.6 9.2  MG  --  2.2    Liver Function Tests: Recent Labs  Lab 04/29/19 1637  AST 25  ALT 32  ALKPHOS 73  BILITOT 0.5  PROT 7.4  ALBUMIN 4.1   No results for input(s): LIPASE, AMYLASE in the last 168 hours. No results for input(s): AMMONIA in the last 168 hours.  CBC: Recent Labs  Lab 04/29/19 1637 04/30/19 0415  WBC 8.1 7.6  NEUTROABS 3.1  --   HGB 13.7 13.5  HCT 41.3 41.0  MCV 102.0* 102.8*  PLT 211 207    Cardiac Enzymes: No results for input(s): CKTOTAL, CKMB, CKMBINDEX, TROPONINI in the last 168 hours.  BNP: Invalid input(s): POCBNP  CBG: No results  for input(s): GLUCAP in the last 168 hours.  Microbiology: Results for orders placed or performed during the hospital encounter of 04/29/19  SARS CORONAVIRUS 2 (TAT 6-24 HRS) Nasopharyngeal Nasopharyngeal Swab     Status: None   Collection Time: 04/29/19  4:38 PM   Specimen: Nasopharyngeal Swab  Result Value Ref Range Status   SARS Coronavirus 2 NEGATIVE NEGATIVE Final    Comment: (NOTE) SARS-CoV-2 target nucleic acids are NOT DETECTED. The SARS-CoV-2 RNA is generally detectable in upper and lower respiratory specimens during the acute phase of infection. Negative results do not preclude SARS-CoV-2 infection, do not rule out co-infections with other pathogens, and should not be used as the sole basis for treatment or other patient management decisions. Negative results must be combined with clinical observations, patient history, and epidemiological information. The expected result is Negative. Fact Sheet for Patients: HairSlick.no Fact Sheet for Healthcare Providers: quierodirigir.com This test is not yet approved or cleared by the [place] FDA and  has been authorized for detection and/or diagnosis of  SARS-CoV-2 by FDA under an Emergency Use Authorization (EUA). This EUA will remain  in effect (meaning this test can be used) for the duration of the COVID-19 declaration under Section 56 4(b)(1) of the Act, 21 U.S.C. section 360bbb-3(b)(1), unless the authorization is terminated or revoked sooner. Performed at [facility], [address] [place], [place] [address]     Coagulation Studies: No results for input(s): LABPROT, INR in the last 72 hours.  Urinalysis:  Recent Labs  Lab 04/29/19 2315  COLORURINE YELLOW*  LABSPEC 1.011  PHURINE 6.0  GLUCOSEU NEGATIVE  HGBUR NEGATIVE  BILIRUBINUR NEGATIVE  KETONESUR NEGATIVE  PROTEINUR NEGATIVE  NITRITE NEGATIVE  LEUKOCYTESUR TRACE*    Lipid Panel:  No results found for: CHOL, TRIG, HDL, CHOLHDL, VLDL, LDLCALC  HgbA1C:  Lab Results  Component Value Date   HGBA1C 6.1 (H) 12/28/2016    Urine Drug Screen:      Component Value Date/Time   LABOPIA NONE DETECTED 04/29/2019 2315   COCAINSCRNUR NONE DETECTED 04/29/2019 2315   LABBENZ NONE DETECTED 04/29/2019 2315   AMPHETMU NONE DETECTED 04/29/2019 2315   THCU NONE DETECTED 04/29/2019 2315   LABBARB NONE DETECTED 04/29/2019 2315    Alcohol Level: No results for input(s): ETH in the last 168 hours.  Other results: EKG: sinus rhythm at 82 bpm.  Imaging: EXAM: MRI HEAD WITHOUT CONTRAST  TECHNIQUE: Multiplanar, multiecho pulse sequences of the brain and surrounding structures were obtained without intravenous contrast.  COMPARISON:  Prior CT and CTA from earlier same day.  FINDINGS: Brain: Generalized age-related cerebral atrophy. Patchy and confluent T2/FLAIR hyperintensity within the periventricular deep white matter both cerebral hemispheres most consistent with chronic small vessel ischemic disease, moderate in nature.  No abnormal foci of restricted diffusion to suggest acute or subacute ischemia. Tiny focus of apparent diffusion signal abnormality  involving the central aspect of the splenium on axial DWI image 29 favored to be artifactual nature, with no signal abnormality seen on corresponding FLAIR sequence. Gray-white matter differentiation maintained without evidence for chronic cortical infarction. No acute intracranial hemorrhage. Single chronic microhemorrhage noted at the subcortical right frontal lobe, of doubtful significance in isolation.  No mass lesion, midline shift or mass effect. No intrinsic temporal lobe abnormality. Diffuse ventricular prominence related global parenchymal volume loss of hydrocephalus. No extra-axial fluid collection. Pituitary gland suprasellar region within normal limits. Midline structures intact.  Vascular: Major intracranial vascular flow voids are maintained.  Skull and  upper cervical spine: Craniocervical junction within normal limits. Bone marrow signal intensity normal. No scalp soft tissue abnormality.  Sinuses/Orbits: Patient status post bilateral ocular lens replacement. Globes orbital soft tissues demonstrate no acute finding. Mild scattered mucosal thickening noted within the ethmoidal air cells. Paranasal sinuses are otherwise clear. No mastoid effusion. Inner ear structures normal.  Other: None.  IMPRESSION: 1. No acute intracranial abnormality. 2. Generalized age-related cerebral atrophy with moderate chronic small vessel ischemic disease.    Assessment/Plan: 85 year old female with a history of seizures on Keppra at 250mg  BID presenting with breakthrough seizures.  Has been stable overnight.  Keppra increased to 500mg  BID.  Recommendations: 1. Continue Keppra at 500mg  BID.  May increase further if seizures continue.   2. Seizure precautions   Thana FarrLeslie Neveyah Garzon, MD Neurology [phone] 04/30/2019, 11:58 AM

## 2019-04-30 NOTE — Evaluation (Signed)
Physical Therapy Evaluation Patient Details Name: Yolanda Casey MRN: [id number] DOB: 28-Jan-1926 Today's Date: 04/30/2019   History of Present Illness  presented to ER secondary to witnessed seizure activity; admitted for acute management of seizures, encephalopathy.  Clinical Impression  Upon evaluation, patient alert and oriented to self; follows simple verbal commands, though does require increased time for processing and task initiation.  Bilat UE/LE strength and ROM generally symmetrical; mild weakness in L LE (most notable with gait).  Currently requiring min assist for sit/stand, basic transfers and gait (3') with RW.  Demonstrates shuffling gait pattern with decreased L step height/length (improved with manaul facilitation of R ant/lateral weight shift); veers L, min assist for walker mangement and position along pathway.  Poor dynamic balance; unsafe to attempt without +1 at all times. Would benefit from skilled PT to address above deficits and promote optimal return to PLOF.; recommend transition to STR upon discharge from acute hospitalization.     Follow Up Recommendations SNF    Equipment Recommendations  Rolling walker with 5" wheels;3in1 (PT)    Recommendations for Other Services       Precautions / Restrictions Precautions Precautions: Fall Precaution Comments: LLQ ostomy Restrictions Weight Bearing Restrictions: No      Mobility  Bed Mobility Overal bed mobility: Needs Assistance Bed Mobility: Supine to Sit   Sidelying to sit: Min assist          Transfers Overall transfer level: Needs assistance Equipment used: Rolling walker (2 wheeled) Transfers: Sit to/from Stand Sit to Stand: Min assist         General transfer comment: pulls on RW despite cuing from therapist  Ambulation/Gait Ambulation/Gait assistance: Min assist Gait Distance (Feet): 90 Feet Assistive device: Rolling walker (2 wheeled)       General Gait Details: shuffling gait  pattern with decreased L step height/length (improved with manaul facilitation of R ant/lateral weight shift); veers L, min assist for walker mangement and position along pathway.  Poor dynamic balance; unsafe to attempt without +1 at all times.  Stairs            Wheelchair Mobility    Modified Rankin (Stroke Patients Only)       Balance Overall balance assessment: Needs assistance Sitting-balance support: No upper extremity supported;Feet supported Sitting balance-Leahy Scale: Good     Standing balance support: Bilateral upper extremity supported Standing balance-Leahy Scale: Poor                               Pertinent Vitals/Pain Pain Assessment: No/denies pain    Home Living Family/patient expects to be discharged to:: Private residence Living Arrangements: Children Available Help at Discharge: Family Type of Home: House Home Access: Level entry     Home Layout: Two level        Prior Function Level of Independence: Independent         Comments: Indep for ADLs, household mobilization without assist device; family assists with household chores, IADLs as needed.  Denies fall history.     Hand Dominance   Dominant Hand: Right    Extremity/Trunk Assessment   Upper Extremity Assessment Upper Extremity Assessment: Overall WFL for tasks assessed(grossly 4+/5 throughout; no focal weakness, sensory deficit.  Generally bradykinetic with all movement)    Lower Extremity Assessment Lower Extremity Assessment: Overall WFL for tasks assessed(R LE grossly 4+/5, L LE 4-/5)       Communication   Communication: (limited spontaneous  speech)  Cognition Arousal/Alertness: Awake/alert Behavior During Therapy: WFL for tasks assessed/performed Overall Cognitive Status: Impaired/Different from baseline                                 General Comments: oriented to self, month; follows simple commands, but does require increased time for  processing and initiation      General Comments      Exercises     Assessment/Plan    PT Assessment Patient needs continued PT services  PT Problem List Decreased strength;Decreased range of motion;Decreased activity tolerance;Decreased balance;Decreased mobility;Decreased coordination;Decreased cognition;Decreased knowledge of use of DME;Decreased safety awareness;Decreased knowledge of precautions       PT Treatment Interventions DME instruction;Gait training;Stair training;Functional mobility training;Therapeutic activities;Therapeutic exercise;Balance training;Neuromuscular re-education;Patient/family education;Cognitive remediation    PT Goals (Current goals can be found in the Care Plan section)  Acute Rehab PT Goals Patient Stated Goal: per daughter, to consider transition to LTC when appropriate PT Goal Formulation: With patient/family Time For Goal Achievement: 05/14/19 Potential to Achieve Goals: Good    Frequency Min 2X/week   Barriers to discharge        Co-evaluation               AM-PAC PT "6 Clicks" Mobility  Outcome Measure Help needed turning from your back to your side while in a flat bed without using bedrails?: A Little Help needed moving from lying on your back to sitting on the side of a flat bed without using bedrails?: A Little Help needed moving to and from a bed to a chair (including a wheelchair)?: A Little Help needed standing up from a chair using your arms (e.g., wheelchair or bedside chair)?: A Little Help needed to walk in hospital room?: A Little Help needed climbing 3-5 steps with a railing? : A Little 6 Click Score: 18    End of Session Equipment Utilized During Treatment: Gait belt Activity Tolerance: Patient tolerated treatment well Patient left: in chair;with call bell/phone within reach;with bed alarm set;with family/visitor present Nurse Communication: Mobility status PT Visit Diagnosis: Difficulty in walking, not elsewhere  classified (R26.2);Muscle weakness (generalized) (M62.81)    Time: [id number] PT Time Calculation (min) (ACUTE ONLY): 17 min   Charges:   PT Evaluation $PT Eval Moderate Complexity: 1 Mod          Deroy Noah H. Manson Passey, PT, DPT, NCS 04/30/19, 1:42 PM [phone]

## 2019-04-30 NOTE — Progress Notes (Signed)
eeg completed ° °

## 2019-04-30 NOTE — NC FL2 (Signed)
[place] MEDICAID [id number] LEVEL OF CARE SCREENING TOOL     IDENTIFICATION  Patient Name: Yolanda Casey Birthdate: 1926/06/15 Sex: female Admission Date (Current Location): 04/29/2019  [place] and [place] Number:  Chiropodist and Address:  [facility], [address], [place], [place] [address]      Provider Number: [id number]  Attending Physician Name and Address:  Jama Flavors, MD  Relative Name and Phone Number:  Luciano Cutter    Current Level of Care: Hospital Recommended Level of Care: Skilled Nursing Facility Prior Approval Number:    Date Approved/Denied:   PASRR Number: Pending  Discharge Plan: SNF    Current Diagnoses: Patient Active Problem List   Diagnosis Date Noted  . Recurrent seizures (HCC) 04/29/2019  . HTN (hypertension) 04/19/2019  . HLD (hyperlipidemia) 04/19/2019  . Dementia (HCC) 04/19/2019  . CAD (coronary artery disease) 04/19/2019  . Seizure (HCC) 04/19/2019  . Lumbar degenerative disc disease 12/29/2016  . Spinal stenosis 12/29/2016  . Pyuria, sterile 12/29/2016  . Hyperglycemia 12/29/2016  . Generalized weakness 12/29/2016  . Back pain 12/28/2016    Orientation RESPIRATION BLADDER Height & Weight     Self  Normal Incontinent Weight: 65.8 kg Height:  5\' 3"  (160 cm)  BEHAVIORAL SYMPTOMS/MOOD NEUROLOGICAL BOWEL NUTRITION STATUS      Colostomy Diet(heart healthy)  AMBULATORY STATUS COMMUNICATION OF NEEDS Skin   Limited Assist Verbally Normal                       Personal Care Assistance Level of Assistance  Bathing, Feeding, Dressing Bathing Assistance: Limited assistance Feeding assistance: Limited assistance Dressing Assistance: Limited assistance     Functional Limitations Info             SPECIAL CARE FACTORS FREQUENCY  PT (By licensed PT), OT (By licensed OT)     PT Frequency: 5 times per week OT Frequency: 5 times per week            Contractures Contractures Info: Not  present    Additional Factors Info  Allergies, Code Status Code Status Info: DNR Allergies Info: Codeine, omeprazole, pcn, atenolol, levofloxacin, tape, albuterol, atorvastatin, cipro, diclofenac, fluoxetine, indomethacin, latex, losartan, oxybutyin, oxycodone, seroquel, sucralfate, tramadol, gabapentin           Current Medications (04/30/2019):  This is the current hospital active medication list Current Facility-Administered Medications  Medication Dose Route Frequency Provider Last Rate Last Dose  . 0.9 %  sodium chloride infusion  75 mL/hr Intravenous Continuous 05/02/2019, NP 75 mL/hr at 04/30/19 0325 75 mL/hr at 04/30/19 0325  . acetaminophen (TYLENOL) tablet 650 mg  650 mg Oral Q6H PRN 05/02/19, MD       Or  . acetaminophen (TYLENOL) suppository 650 mg  650 mg Rectal Q6H PRN Houston Siren, MD      . aspirin EC tablet 81 mg  81 mg Oral Daily Houston Siren, MD   81 mg at 04/30/19 0804  . DULoxetine (CYMBALTA) DR capsule 40 mg  40 mg Oral Daily 05/02/19, MD   40 mg at 04/30/19 0805  . enoxaparin (LOVENOX) injection 40 mg  40 mg Subcutaneous Q24H 05/02/19, MD   40 mg at 04/29/19 2245  . haloperidol lactate (HALDOL) injection 2.5 mg  2.5 mg Intravenous Q6H PRN 05/01/19, MD   2.5 mg at 04/29/19 1918  . levETIRAcetam (KEPPRA) IVPB 500 mg/100 mL premix  500 mg Intravenous Q12H Henreitta Leber, MD 400 mL/hr at 04/30/19 0804 500 mg at 04/30/19 0804  . LORazepam (ATIVAN) injection 1-2 mg  1-2 mg Intravenous Q2H PRN Lang Snow, NP      . MEDLINE mouth rinse  15 mL Mouth Rinse BID Stark Jock, Jude, MD   15 mL at 04/30/19 0805  . metoprolol succinate (TOPROL-XL) 24 hr tablet 12.5 mg  12.5 mg Oral Daily Henreitta Leber, MD   12.5 mg at 04/30/19 0804  . ondansetron (ZOFRAN) tablet 4 mg  4 mg Oral Q6H PRN Henreitta Leber, MD       Or  . ondansetron (ZOFRAN) injection 4 mg  4 mg Intravenous Q6H PRN Henreitta Leber, MD      . QUEtiapine  (SEROQUEL) tablet 50 mg  50 mg Oral QHS Henreitta Leber, MD   50 mg at 04/29/19 2246  . ramipril (ALTACE) capsule 5 mg  5 mg Oral Daily Henreitta Leber, MD   5 mg at 04/30/19 0805     Discharge Medications: Please see discharge summary for a list of discharge medications.  Relevant Imaging Results:  Relevant Lab Results:   Additional Information SS# [id number]  Shelbie Hutching, RN

## 2019-04-30 NOTE — Progress Notes (Signed)
[facility] [place] at [facility]   PATIENT NAME: Robena Ewy    MR#:  [id number]  DATE OF BIRTH:  1925-08-28  SUBJECTIVE:  CHIEF COMPLAINT:   Chief Complaint  Patient presents with  . Seizures   No new complaint this morning.  No further seizures reported overnight.  Patient resting comfortably  REVIEW OF SYSTEMS:  ROS unobtainable due to underlying dementia  DRUG ALLERGIES:   Allergies  Allergen Reactions  . Codeine Other (See Comments)    Reaction: unknown  . Omeprazole Other (See Comments)    Other Reaction: gi upset  . Penicillins Itching and Other (See Comments)    Patient was adamant that he has a "strong allergy to penicillins" Did it involve swelling of the face/tongue/throat, SOB, or low BP? Unknown Did it involve sudden or severe rash/hives, skin peeling, or any reaction on the inside of your mouth or nose? Unknown Did you need to seek medical attention at a hospital or doctor's office? Unknown When did it last happen?unknown If all above answers are "NO", may proceed with cephalosporin use.   . Atenolol Other (See Comments)    Other Reaction: DROP IN BP  . Levofloxacin Diarrhea  . Adhesive [Tape] Other (See Comments)    Reaction: unknown  . Albuterol Other (See Comments)    Reaction: unknown  . Atorvastatin Other (See Comments)    Reaction: unknown  . Ciprofloxacin Other (See Comments)    Reaction: unknown  . Diclofenac Other (See Comments)    Reaction: unknown  . Fluoxetine Other (See Comments)    Reaction: unknown  . Indomethacin Other (See Comments)    Reaction: unknown  . Latex Other (See Comments)    Reaction: unknown  . Losartan Other (See Comments)    Reaction: unknown  . Oxybutynin Chloride Other (See Comments)    Reaction: unknown  . Oxycodone-Acetaminophen Other (See Comments)    Reaction: unknown  . Seroquel [Quetiapine Fumarate] Other (See Comments)    Dystonic reaction  . Sucralfate Other (See Comments)   Reaction: unknown  . Tramadol Diarrhea    Pt and daughter report it gives her diarrhea. Usually probiotics are given to her to help per daughter.   . Gabapentin Nausea Only   VITALS:  Blood pressure (!) 148/65, pulse 72, temperature 97.7 F (36.5 C), temperature source Oral, resp. rate 17, height 5\' 3"  (1.6 m), weight 65.8 kg, SpO2 100 %. PHYSICAL EXAMINATION:  Physical Exam  GENERAL:  85 y.o.-year-old patient lying in the bed in no acute distress.  EYES: Pupils equal, round, reactive to light and accommodation. No scleral icterus. HEENT: Head atraumatic, normocephalic. Oropharynx and nasopharynx clear. No oropharyngeal erythema, moist oral mucosa  NECK:  Supple, no jugular venous distention. No thyroid enlargement, no tenderness.  LUNGS: Normal breath sounds bilaterally, no wheezing, rales, rhonchi. No use of accessory muscles of respiration.  CARDIOVASCULAR: S1, S2 RRR. No murmurs, rubs, gallops, clicks.  ABDOMEN: Soft, nontender, nondistended. Bowel sounds present. No organomegaly or mass.  EXTREMITIES: No pedal edema, cyanosis, or clubbing. + 2 pedal & radial pulses b/l.   NEUROLOGIC: Globally weak.   Gait not checked PSYCHIATRIC: The patient is alert and oriented x 1.  SKIN: No obvious rash, lesion, or ulcer.   LABORATORY PANEL:  Female CBC Recent Labs  Lab 04/30/19 0415  WBC 7.6  HGB 13.5  HCT 41.0  PLT 207   ------------------------------------------------------------------------------------------------------------------ Chemistries  Recent Labs  Lab 04/29/19 1637 04/30/19 0415  NA 139 141  K 3.8 3.2*  CL 105 107  CO2 22 24  GLUCOSE 98 103*  BUN 20 17  CREATININE 0.68 0.65  CALCIUM 9.6 9.2  MG  --  2.2  AST 25  --   ALT 32  --   ALKPHOS 73  --   BILITOT 0.5  --    RADIOLOGY:  No results found. ASSESSMENT AND PLAN:   85 y.o. female with a known history of dementia, hypertension, hyperlipidemia, history of previous CVA, coronary artery disease, recent  admission for seizures who presents to the hospital due to recurrent seizures.  1.  Recurrent seizures-patient was recently hospitalized and thought to have encephalopathy secondary to seizure and discharged on low-dose Keppra.  She had been doing well until the day of admission when she developed multiple episodes of seizures. Patient was seen in the neurologist.  Patient was on Keppra to 50 mg p.o. 3 times daily prior to presentation for history of seizures.  Keppra has been increased to 500 mg twice daily.  May increase further if seizure recurs.  Continue seizure precautions. Follow-up on EEG when done.  2.  History of dementia-patient is high risk for sundowning. -Continue Seroquel, as needed IV Haldol for agitation. Physical therapy consulted.  Follow-up on evaluation and recommendations  3.  Essential hypertension-continue Toprol, ramipril.  4.  Depression-continue Cymbalta.  DVT prophylaxis; Lovenox  All the records are reviewed and case discussed with Care Management/Social Worker. Will call and update family after reviewing EEG report and physical therapy gives recommendation  CODE STATUS: DNR  TOTAL TIME TAKING CARE OF THIS PATIENT: 34 minutes.   More than 50% of the time was spent in counseling/coordination of care: YES  POSSIBLE D/C IN 2 DAYS, DEPENDING ON CLINICAL CONDITION.   Mykenna Viele M.D on 04/30/2019 at 12:50 PM  Between 7am to 6pm - Pager - [phone]  After 6pm go to [email or web address] - Proofreader  [facility] [place] [facility]  Office  [phone]  CC: Primary care physician; Hortencia Pilar, MD  Note: This dictation was prepared with Dragon dictation along with smaller phrase technology. Any transcriptional errors that result from this process are unintentional.

## 2019-05-01 LAB — BASIC METABOLIC PANEL
Anion gap: 12 (ref 5–15)
BUN: 18 mg/dL (ref 8–23)
CO2: 25 mmol/L (ref 22–32)
Calcium: 9.5 mg/dL (ref 8.9–10.3)
Chloride: 101 mmol/L (ref 98–111)
Creatinine, Ser: 0.64 mg/dL (ref 0.44–1.00)
GFR calc Af Amer: >60 mL/min (ref >60)
GFR calc non Af Amer: >60 mL/min (ref >60)
Glucose, Bld: 123 mg/dL — ABNORMAL HIGH (ref 70–99)
Potassium: 3.8 mmol/L (ref 3.5–5.1)
Sodium: 138 mmol/L (ref 135–145)

## 2019-05-01 LAB — MAGNESIUM: Magnesium: 2.1 mg/dL (ref 1.7–2.4)

## 2019-05-01 MED ORDER — LEVETIRACETAM 500 MG PO TABS
500.0000 mg | ORAL_TABLET | Freq: Two times a day (BID) | ORAL | Status: DC
  Administered 2019-05-01: 23:00:00 500 mg via ORAL
  Filled 2019-05-01 (×3): qty 1

## 2019-05-01 MED ORDER — QUETIAPINE FUMARATE 25 MG PO TABS
75.0000 mg | ORAL_TABLET | Freq: Every day | ORAL | Status: DC
  Administered 2019-05-01: 75 mg via ORAL
  Filled 2019-05-01 (×2): qty 3

## 2019-05-01 NOTE — Progress Notes (Signed)
Pharmacy consult for Drug Interaction and Monitioring of Antiepileptic medications  89 to F on Keppra 500mg  IV q12h currently (was on Keppra 250mg  bid PTA)  Drug Interactions:  Currently no significant interactions noted  Monitoring: Neurology following  Chinita Greenland PharmD Clinical Pharmacist 05/01/2019

## 2019-05-01 NOTE — Progress Notes (Signed)
Yolanda Casey at [place] NAME: Yolanda Casey    MR#:  [id number]  DATE OF BIRTH:  09-Oct-1925  SUBJECTIVE:  CHIEF COMPLAINT:   Chief Complaint  Patient presents with  . Seizures   No new complaint this morning.  No further seizures reported overnight.  Patient resting comfortably  REVIEW OF SYSTEMS:  ROS unobtainable due to underlying dementia  DRUG ALLERGIES:   Allergies  Allergen Reactions  . Codeine Other (See Comments)    Reaction: unknown  . Omeprazole Other (See Comments)    Other Reaction: gi upset  . Penicillins Itching and Other (See Comments)    Patient was adamant that he has a "strong allergy to penicillins" Did it involve swelling of the face/tongue/throat, SOB, or low BP? Unknown Did it involve sudden or severe rash/hives, skin peeling, or any reaction on the inside of your mouth or nose? Unknown Did you need to seek medical attention at a hospital or doctor's office? Unknown When did it last happen?unknown If all above answers are "NO", may proceed with cephalosporin use.   . Atenolol Other (See Comments)    Other Reaction: DROP IN BP  . Levofloxacin Diarrhea  . Adhesive [Tape] Other (See Comments)    Reaction: unknown  . Albuterol Other (See Comments)    Reaction: unknown  . Atorvastatin Other (See Comments)    Reaction: unknown  . Ciprofloxacin Other (See Comments)    Reaction: unknown  . Diclofenac Other (See Comments)    Reaction: unknown  . Fluoxetine Other (See Comments)    Reaction: unknown  . Indomethacin Other (See Comments)    Reaction: unknown  . Latex Other (See Comments)    Reaction: unknown  . Losartan Other (See Comments)    Reaction: unknown  . Oxybutynin Chloride Other (See Comments)    Reaction: unknown  . Oxycodone-Acetaminophen Other (See Comments)    Reaction: unknown  . Seroquel [Quetiapine Fumarate] Other (See Comments)    Dystonic reaction  . Sucralfate Other (See Comments)   Reaction: unknown  . Tramadol Diarrhea    Pt and daughter report it gives her diarrhea. Usually probiotics are given to her to help per daughter.   . Gabapentin Nausea Only   VITALS:  Blood pressure (!) 148/78, pulse 84, temperature 97.8 F (36.6 C), temperature source Oral, resp. rate 17, height 5\' 3"  (1.6 m), weight 65.8 kg, SpO2 95 %. PHYSICAL EXAMINATION:  Physical Exam  GENERAL:  85 y.o.-year-old patient lying in the bed in no acute distress.  EYES: Pupils equal, round, reactive to light and accommodation. No scleral icterus. HEENT: Head atraumatic, normocephalic. Oropharynx and nasopharynx clear. No oropharyngeal erythema, moist oral mucosa  NECK:  Supple, no jugular venous distention. No thyroid enlargement, no tenderness.  LUNGS: Normal breath sounds bilaterally, no wheezing, rales, rhonchi. No use of accessory muscles of respiration.  CARDIOVASCULAR: S1, S2 RRR. No murmurs, rubs, gallops, clicks.  ABDOMEN: Soft, nontender, nondistended. Bowel sounds present.  Patient has colostomy bag in place.  EXTREMITIES: No pedal edema, cyanosis, or clubbing. + 2 pedal & radial pulses b/l.   NEUROLOGIC: Globally weak.   Gait not checked PSYCHIATRIC: The patient is alert and oriented x 1.  SKIN: No obvious rash, lesion, or ulcer.   LABORATORY PANEL:  Female CBC Recent Labs  Lab 04/30/19 0415  WBC 7.6  HGB 13.5  HCT 41.0  PLT 207   ------------------------------------------------------------------------------------------------------------------ Chemistries  Recent Labs  Lab 04/29/19 1637  05/01/19 4742  NA 139   < > 138  K 3.8   < > 3.8  CL 105   < > 101  CO2 22   < > 25  GLUCOSE 98   < > 123*  BUN 20   < > 18  CREATININE 0.68   < > 0.64  CALCIUM 9.6   < > 9.5  MG  --    < > 2.1  AST 25  --   --   ALT 32  --   --   ALKPHOS 73  --   --   BILITOT 0.5  --   --    < > = values in this interval not displayed.   RADIOLOGY:  No results found. ASSESSMENT AND PLAN:   85 y.o.  female with a known history of dementia, hypertension, hyperlipidemia, history of previous CVA, coronary artery disease, recent admission for seizures who presents to the hospital due to recurrent seizures.  1.  Recurrent seizures-patient was recently hospitalized and thought to have encephalopathy secondary to seizure and discharged on low-dose Keppra.  She had been doing well until the day of admission when she developed multiple episodes of seizures. Patient was seen in the neurologist.  Patient was on Keppra to 250 mg p.o. 2 times daily prior to presentation for history of seizures.  Keppra has been increased to 500 mg twice daily.  No further seizures reported.  Continue seizure precautions. EEG done revealed an abnormal EEG secondary to posterior background slowing.  This finding may be seen with a diffuse gray matter disturbance that is etiologically nonspecific, but may include a dementia, among other possibilities.  No epileptiform activity is noted.   2.  History of dementia-patient is high risk for sundowning. -Nursing staff reported patient did not sleep well last night with some confusion.  Increased Seroquel from 50 to 75 mg p.o. nightly.  Monitor.   Patient seen by physical therapist.  Skilled nursing facility placement recommended  3.  Essential hypertension-continue Toprol, ramipril.  4.  Depression-continue Cymbalta.  DVT prophylaxis; Lovenox  All the records are reviewed and case discussed with Care Management/Social Worker. Called patient's daughter listed in the chart Yolanda Casey and updated her on treatment plans as well as discharge plans as outlined above.  All questions were answered.  CODE STATUS: DNR  TOTAL TIME TAKING CARE OF THIS PATIENT: 32 minutes.   More than 50% of the time was spent in counseling/coordination of care: YES  POSSIBLE D/C IN 2 DAYS, DEPENDING ON CLINICAL CONDITION.   Yolanda Casey M.D on 05/01/2019 at 10:51 AM  Between 7am to 6pm - Pager -  [phone]  After 6pm go to [email or web address] - Social research officer, government  [facility]  Office  [phone]  CC: Primary care physician; Yolanda Gala, MD  Note: This dictation was prepared with Dragon dictation along with smaller phrase technology. Any transcriptional errors that result from this process are unintentional.

## 2019-05-01 NOTE — Progress Notes (Signed)
To whom it may concern Yolanda Casey will require a short term nursing home stay- anticipated 30 days or less for rehabilitation and strengthening.  The plan is for return home.

## 2019-05-02 LAB — GLUCOSE, CAPILLARY: Glucose-Capillary: 115 mg/dL — ABNORMAL HIGH (ref 70–99)

## 2019-05-02 MED ORDER — SODIUM CHLORIDE 0.9 % IV SOLN
INTRAVENOUS | Status: DC | PRN
  Administered 2019-05-02: 1000 mL via INTRAVENOUS
  Administered 2019-05-03: 09:00:00 via INTRAVENOUS
  Administered 2019-05-04: 1000 mL via INTRAVENOUS
  Administered 2019-05-05: 12:00:00 250 mL via INTRAVENOUS

## 2019-05-02 MED ORDER — LEVETIRACETAM IN NACL 1000 MG/100ML IV SOLN
1000.0000 mg | Freq: Two times a day (BID) | INTRAVENOUS | Status: DC
  Administered 2019-05-02 – 2019-05-06 (×9): 1000 mg via INTRAVENOUS
  Filled 2019-05-02 (×13): qty 100

## 2019-05-02 MED ORDER — MORPHINE SULFATE (PF) 2 MG/ML IV SOLN
2.0000 mg | Freq: Once | INTRAVENOUS | Status: AC
  Administered 2019-05-02: 18:00:00 2 mg via INTRAVENOUS
  Filled 2019-05-02: qty 1

## 2019-05-02 NOTE — Progress Notes (Signed)
Physical Therapy Treatment Patient Details Name: Yolanda Casey MRN: [id number] DOB: Jan 30, 1926 Today's Date: 05/02/2019    History of Present Illness presented to ER secondary to witnessed seizure activity; admitted for acute management of seizures, encephalopathy.    PT Comments    Pt asleep but awoke to voice.  BLE AAROM andkle pumps, heel slides, ab/add x 10.  Te edge of bed with mod a x 1 with difficulty maintaining balance.  Pt inc urine and was assisted to standing for care.  Pt was able to progress gait to 200' today with min a x 1.  Overall improved mobility but pt with difficulty navigating walker without bumping into walls and objects in hallway when left to try to navigate on her own.  Daughter stated that is new for her and stated gait was not at baseline "It's a start"  Remained in recliner after gait with daughter to encourage her to eat.   Follow Up Recommendations  SNF     Equipment Recommendations  Rolling walker with 5" wheels;3in1 (PT)    Recommendations for Other Services       Precautions / Restrictions Precautions Precautions: Fall Precaution Comments: LLQ ostomy Restrictions Weight Bearing Restrictions: No    Mobility  Bed Mobility Overal bed mobility: Needs Assistance Bed Mobility: Supine to Sit   Sidelying to sit: Min assist;Mod assist          Transfers Overall transfer level: Needs assistance Equipment used: Rolling walker (2 wheeled) Transfers: Sit to/from Stand Sit to Stand: Min assist         General transfer comment: pulls on RW despite cuing from therapist  Ambulation/Gait Ambulation/Gait assistance: Min assist Gait Distance (Feet): 200 Feet Assistive device: Rolling walker (2 wheeled) Gait Pattern/deviations: Step-through pattern;Decreased step length - right;Decreased step length - left;Trunk flexed Gait velocity: decreased       Stairs             Wheelchair Mobility    Modified Rankin (Stroke Patients  Only)       Balance Overall balance assessment: Needs assistance Sitting-balance support: No upper extremity supported;Feet supported Sitting balance-Leahy Scale: Good     Standing balance support: Bilateral upper extremity supported Standing balance-Leahy Scale: Poor Standing balance comment: requries hands on assist at all times                            Cognition Arousal/Alertness: Awake/alert Behavior During Therapy: WFL for tasks assessed/performed Overall Cognitive Status: Impaired/Different from baseline                                        Exercises      General Comments        Pertinent Vitals/Pain Pain Assessment: No/denies pain    Home Living                      Prior Function            PT Goals (current goals can now be found in the care plan section) Progress towards PT goals: Progressing toward goals    Frequency    Min 2X/week      PT Plan Current plan remains appropriate    Co-evaluation              AM-PAC PT "6 Clicks" Mobility   Outcome Measure  Help needed turning from your back to your side while in a flat bed without using bedrails?: A Little Help needed moving from lying on your back to sitting on the side of a flat bed without using bedrails?: A Little Help needed moving to and from a bed to a chair (including a wheelchair)?: A Little Help needed standing up from a chair using your arms (e.g., wheelchair or bedside chair)?: A Little Help needed to walk in hospital room?: A Little Help needed climbing 3-5 steps with a railing? : A Little 6 Click Score: 18    End of Session Equipment Utilized During Treatment: Gait belt Activity Tolerance: Patient tolerated treatment well Patient left: in chair;with call bell/phone within reach;with bed alarm set;with family/visitor present Nurse Communication: Mobility status       Time: 3500-9381 PT Time Calculation (min) (ACUTE ONLY): 28  min  Charges:  $Gait Training: 8-22 mins $Therapeutic Activity: 8-22 mins                    Chesley Noon, PTA 05/02/19, 11:16 AM

## 2019-05-02 NOTE — TOC Progression Note (Signed)
Transition of Care [facility]) - Progression Note    Patient Details  Name: Yolanda Casey MRN: [id number] Date of Birth: [birth year]  [facility] CM/SW Contact  Shelbie Hutching, RN Phone Number: 05/02/2019, 11:58 AM  Clinical Narrative:    [facility] [id number] E- Exp 06/01/19.  [facility] offered a bed but patient needs to be seizure free for 48 hours before discharge.  Patient did have a seizure today.     Expected Discharge Plan: [place] Barriers to Discharge: Continued Medical Work up  Expected Discharge Plan and Services Expected Discharge Plan: [place]   Discharge Planning Services: CM Consult   Living arrangements for the past 2 months: Single Family Home                                       Social Determinants of Health (SDOH) Interventions    Readmission Risk Interventions Readmission Risk Prevention Plan 04/20/2019  Transportation Screening Complete  PCP or Specialist Appt within 5-7 Days Complete  Home Care Screening Complete  Medication Review (RN CM) Complete  Some recent data might be hidden

## 2019-05-02 NOTE — Progress Notes (Signed)
This RN entered room to find pt with increased seizure activity without relief after PRN ativan (see MAR). Spoke at length with daughter [name] who understands her mom is seizing much more than she has been previously, therefore is likely suffering more than she has been previously. While assessing the patient, writer observed patient's breathing becoming more labored and shallow. Dr. Stark Jock made aware, morphine ordered to ease breathing. 2mg  morphine given IV per verbal order.  Palliative to consult in the morning.  To note, arm at IV site became red and slightly warm directly after morphine administration, but symptoms subsided within five minutes. Night shift aware and if needed, will dilute with NS.

## 2019-05-02 NOTE — Progress Notes (Signed)
[employer] [facility] - [place] at [facility]   PATIENT NAME: Yolanda Casey    MR#:  [id number]  DATE OF BIRTH:  26-Feb-1926  SUBJECTIVE:  CHIEF COMPLAINT:   Chief Complaint  Patient presents with  . Seizures   No new complaints overnight.  This morning however patient had a witnessed seizure.  Was given a dose of IV Ativan.  Dose of Keppra increased.  REVIEW OF SYSTEMS:  ROS unobtainable due to underlying dementia  DRUG ALLERGIES:   Allergies  Allergen Reactions  . Codeine Other (See Comments)    Reaction: unknown  . Omeprazole Other (See Comments)    Other Reaction: gi upset  . Penicillins Itching and Other (See Comments)    Patient was adamant that he has a "strong allergy to penicillins" Did it involve swelling of the face/tongue/throat, SOB, or low BP? Unknown Did it involve sudden or severe rash/hives, skin peeling, or any reaction on the inside of your mouth or nose? Unknown Did you need to seek medical attention at a hospital or doctor's office? Unknown When did it last happen?unknown If all above answers are "NO", may proceed with cephalosporin use.   . Atenolol Other (See Comments)    Other Reaction: DROP IN BP  . Levofloxacin Diarrhea  . Adhesive [Tape] Other (See Comments)    Reaction: unknown  . Albuterol Other (See Comments)    Reaction: unknown  . Atorvastatin Other (See Comments)    Reaction: unknown  . Ciprofloxacin Other (See Comments)    Reaction: unknown  . Diclofenac Other (See Comments)    Reaction: unknown  . Fluoxetine Other (See Comments)    Reaction: unknown  . Indomethacin Other (See Comments)    Reaction: unknown  . Latex Other (See Comments)    Reaction: unknown  . Losartan Other (See Comments)    Reaction: unknown  . Oxybutynin Chloride Other (See Comments)    Reaction: unknown  . Oxycodone-Acetaminophen Other (See Comments)    Reaction: unknown  . Seroquel [Quetiapine Fumarate] Other (See Comments)    Dystonic  reaction  . Sucralfate Other (See Comments)    Reaction: unknown  . Tramadol Diarrhea    Pt and daughter report it gives her diarrhea. Usually probiotics are given to her to help per daughter.   . Gabapentin Nausea Only   VITALS:  Blood pressure (!) 151/86, pulse 89, temperature 97.9 F (36.6 C), temperature source Oral, resp. rate (!) 24, height 5\' 3"  (1.6 m), weight 65.8 kg, SpO2 96 %. PHYSICAL EXAMINATION:  Physical Exam  GENERAL:  85 y.o.-year-old patient lying in the bed in no acute distress.  EYES: Pupils equal, round, reactive to light and accommodation. No scleral icterus. HEENT: Head atraumatic, normocephalic. Oropharynx and nasopharynx clear. No oropharyngeal erythema, moist oral mucosa  NECK:  Supple, no jugular venous distention. No thyroid enlargement, no tenderness.  LUNGS: Normal breath sounds bilaterally, no wheezing, rales, rhonchi. No use of accessory muscles of respiration.  CARDIOVASCULAR: S1, S2 RRR. No murmurs, rubs, gallops, clicks.  ABDOMEN: Soft, nontender, nondistended. Bowel sounds present.  Patient has colostomy bag in place.  EXTREMITIES: No pedal edema, cyanosis, or clubbing. + 2 pedal & radial pulses b/l.   NEUROLOGIC: Patient postictal from recent seizure  PSYCHIATRIC: Patient postictal SKIN: No obvious rash, lesion, or ulcer.   LABORATORY PANEL:  Female CBC Recent Labs  Lab 04/30/19 0415  WBC 7.6  HGB 13.5  HCT 41.0  PLT 207   ------------------------------------------------------------------------------------------------------------------ Chemistries  Recent Labs  Lab  04/29/19 1637  05/01/19 0619  NA 139   < > 138  K 3.8   < > 3.8  CL 105   < > 101  CO2 22   < > 25  GLUCOSE 98   < > 123*  BUN 20   < > 18  CREATININE 0.68   < > 0.64  CALCIUM 9.6   < > 9.5  MG  --    < > 2.1  AST 25  --   --   ALT 32  --   --   ALKPHOS 73  --   --   BILITOT 0.5  --   --    < > = values in this interval not displayed.   RADIOLOGY:  No results  found. ASSESSMENT AND PLAN:   85 y.o. female with a known history of dementia, hypertension, hyperlipidemia, history of previous CVA, coronary artery disease, recent admission for seizures who presents to the hospital due to recurrent seizures.  1.  Recurrent seizures-patient was recently hospitalized and thought to have encephalopathy secondary to seizure and discharged on low-dose Keppra.  She had been doing well until the day of admission when she developed multiple episodes of seizures. Patient was seen in the neurologist.  Patient was on Keppra to 250 mg p.o. 2 times daily prior to presentation for history of seizures.  Keppra had been increased to 500 mg twice daily. This morning however patient had another witnessed seizure episode.  Was given a dose of IV Ativan to abort the seizures.  Dose of Keppra further increased to 1000 mg twice daily.  Continue seizure precautions. EEG done revealed an abnormal EEG secondary to posterior background slowing.  This finding may be seen with a diffuse gray matter disturbance that is etiologically nonspecific, but may include a dementia, among other possibilities.  No epileptiform activity is noted.  Nursing home stating patient needs to be without seizures for 48 hours before accepted  2.  History of dementia-patient is high risk for sundowning. -Stable   3.  Essential hypertension-continue Toprol, ramipril.  4.  Depression-continue Cymbalta.  DVT prophylaxis; Lovenox  All the records are reviewed and case discussed with Care Management/Social Worker. Updated patient's daughter at bedside this morning on treatment plans.  All questions were answered.    CODE STATUS: DNR  TOTAL TIME TAKING CARE OF THIS PATIENT: 31 minutes.   More than 50% of the time was spent in counseling/coordination of care: YES  POSSIBLE D/C IN 2 DAYS, DEPENDING ON CLINICAL CONDITION.   Karmine Kauer M.D on 05/02/2019 at 1:43 PM  Between 7am to 6pm - Pager -  [phone]  After 6pm go to [email or web address] - Proofreader  [facility]  Office  [phone]  CC: Primary care physician; Hortencia Pilar, MD  Note: This dictation was prepared with Dragon dictation along with smaller phrase technology. Any transcriptional errors that result from this process are unintentional.

## 2019-05-02 NOTE — Care Management Important Message (Signed)
Important Message  Patient Details  Name: Yolanda Casey MRN: [id number] Date of Birth: 1925/08/08   Medicare Important Message Given:  Yes     Juliann Pulse A [place] 05/02/2019, 11:39 AM

## 2019-05-03 LAB — CBC
HCT: 41.9 % (ref 36.0–46.0)
Hemoglobin: 14.3 g/dL (ref 12.0–15.0)
MCH: 34.3 pg — ABNORMAL HIGH (ref 26.0–34.0)
MCHC: 34.1 g/dL (ref 30.0–36.0)
MCV: 100.5 fL — ABNORMAL HIGH (ref 80.0–100.0)
Platelets: 233 10*3/uL (ref 150–400)
RBC: 4.17 MIL/uL (ref 3.87–5.11)
RDW: 13.2 % (ref 11.5–15.5)
WBC: 12.3 10*3/uL — ABNORMAL HIGH (ref 4.0–10.5)
nRBC: 0 % (ref 0.0–0.2)

## 2019-05-03 LAB — BASIC METABOLIC PANEL
Anion gap: 13 (ref 5–15)
BUN: 25 mg/dL — ABNORMAL HIGH (ref 8–23)
CO2: 24 mmol/L (ref 22–32)
Calcium: 9.5 mg/dL (ref 8.9–10.3)
Chloride: 100 mmol/L (ref 98–111)
Creatinine, Ser: 0.78 mg/dL (ref 0.44–1.00)
GFR calc Af Amer: >60 mL/min (ref >60)
GFR calc non Af Amer: >60 mL/min (ref >60)
Glucose, Bld: 147 mg/dL — ABNORMAL HIGH (ref 70–99)
Potassium: 3.8 mmol/L (ref 3.5–5.1)
Sodium: 137 mmol/L (ref 135–145)

## 2019-05-03 MED ORDER — POLYETHYLENE GLYCOL 3350 17 G PO PACK: 17.0000 g | PACK | Freq: Every day | ORAL | Status: DC

## 2019-05-03 NOTE — Progress Notes (Signed)
[employer] - [place] at [facility]   PATIENT NAME: Yolanda Casey    MR#:  [id number]  DATE OF BIRTH:  [birth year]  SUBJECTIVE:  CHIEF COMPLAINT:   Chief Complaint  Patient presents with  . Seizures   Patient had seizures yesterday morning.  Dose of Keppra had to be increased.  Patient became more lethargic progressively and still very lethargic this morning.  Family wishes to keep patient comfortable going forward.  Requested for palliative consult this morning.  REVIEW OF SYSTEMS:  ROS unobtainable due to underlying dementia  DRUG ALLERGIES:   Allergies  Allergen Reactions  . Codeine Other (See Comments)    Reaction: unknown  . Omeprazole Other (See Comments)    Other Reaction: gi upset  . Penicillins Itching and Other (See Comments)    Patient was adamant that he has a "strong allergy to penicillins" Did it involve swelling of the face/tongue/throat, SOB, or low BP? Unknown Did it involve sudden or severe rash/hives, skin peeling, or any reaction on the inside of your mouth or nose? Unknown Did you need to seek medical attention at a hospital or doctor's office? Unknown When did it last happen?unknown If all above answers are "NO", may proceed with cephalosporin use.   . Atenolol Other (See Comments)    Other Reaction: DROP IN BP  . Levofloxacin Diarrhea  . Adhesive [Tape] Other (See Comments)    Reaction: unknown  . Albuterol Other (See Comments)    Reaction: unknown  . Atorvastatin Other (See Comments)    Reaction: unknown  . Ciprofloxacin Other (See Comments)    Reaction: unknown  . Diclofenac Other (See Comments)    Reaction: unknown  . Fluoxetine Other (See Comments)    Reaction: unknown  . Indomethacin Other (See Comments)    Reaction: unknown  . Latex Other (See Comments)    Reaction: unknown  . Losartan Other (See Comments)    Reaction: unknown  . Oxybutynin Chloride Other (See Comments)    Reaction: unknown  .  Oxycodone-Acetaminophen Other (See Comments)    Reaction: unknown  . Seroquel [Quetiapine Fumarate] Other (See Comments)    Dystonic reaction  . Sucralfate Other (See Comments)    Reaction: unknown  . Tramadol Diarrhea    Pt and daughter report it gives her diarrhea. Usually probiotics are given to her to help per daughter.   . Gabapentin Nausea Only   VITALS:  Blood pressure (!) 138/97, pulse (!) 102, temperature 98.7 F (37.1 C), resp. rate 17, height 5\' 3"  (1.6 m), weight 65.8 kg, SpO2 98 %. PHYSICAL EXAMINATION:  Physical Exam  GENERAL:  85 y.o.-year-old patient lying in the bed in no acute distress.  EYES: Pupils equal, round, reactive to light and accommodation. No scleral icterus. HEENT: Head atraumatic, normocephalic. Oropharynx and nasopharynx clear. No oropharyngeal erythema, moist oral mucosa  NECK:  Supple, no jugular venous distention. No thyroid enlargement, no tenderness.  LUNGS: Normal breath sounds bilaterally, no wheezing, rales, rhonchi. No use of accessory muscles of respiration.  CARDIOVASCULAR: S1, S2 RRR. No murmurs, rubs, gallops, clicks.  ABDOMEN: Soft, nontender, nondistended. Bowel sounds present.  Patient has colostomy bag in place.  EXTREMITIES: No pedal edema, cyanosis, or clubbing. + 2 pedal & radial pulses b/l.   NEUROLOGIC: Patient is very lethargic.  Full neuro exam not done PSYCHIATRIC: Patient lethargic. SKIN: No obvious rash, lesion, or ulcer.   LABORATORY PANEL:  Female CBC Recent Labs  Lab 05/03/19 0556  WBC 12.3*  HGB  14.3  HCT 41.9  PLT 233   ------------------------------------------------------------------------------------------------------------------ Chemistries  Recent Labs  Lab 04/29/19 1637  05/01/19 0619 05/03/19 0556  NA 139   < > 138 137  K 3.8   < > 3.8 3.8  CL 105   < > 101 100  CO2 22   < > 25 24  GLUCOSE 98   < > 123* 147*  BUN 20   < > 18 25*  CREATININE 0.68   < > 0.64 0.78  CALCIUM 9.6   < > 9.5 9.5  MG  --     < > 2.1  --   AST 25  --   --   --   ALT 32  --   --   --   ALKPHOS 73  --   --   --   BILITOT 0.5  --   --   --    < > = values in this interval not displayed.   RADIOLOGY:  No results found. ASSESSMENT AND PLAN:   85 y.o. female with a known history of dementia, hypertension, hyperlipidemia, history of previous CVA, coronary artery disease, recent admission for seizures who presents to the hospital due to recurrent seizures.  1.  Recurrent seizures-patient was recently hospitalized and thought to have encephalopathy secondary to seizure and discharged on low-dose Keppra.  She had been doing well until the day of admission when she developed multiple episodes of seizures. Patient was seen in the neurologist.  Patient was on Keppra to 250 mg p.o. 2 times daily prior to presentation for history of seizures.  Keppra had been increased to 500 mg twice daily. Yesterday morning patient had witnessed seizures.  Was given a dose of IV Ativan to abort the seizures.  No further seizures reported from overnight Dose of Keppra further increased to 1000 mg twice daily.  Continue seizure precautions. EEG done revealed an abnormal EEG secondary to posterior background slowing.  This finding may be seen with a diffuse gray matter disturbance that is etiologically nonspecific, but may include a dementia, among other possibilities.  No epileptiform activity is noted.  Nursing home stating patient needs to be without seizures for 48 hours before accepted Family has decided on focusing on keeping patient comfortable going forward. Palliative care consult placed today  2.  History of dementia-patient is high risk for sundowning. -Stable   3.  Essential hypertension-continue Toprol, ramipril.  4.  Depression-continue Cymbalta.  DVT prophylaxis; Lovenox  All the records are reviewed and case discussed with Care Management/Social Worker. Updated patient's daughter at bedside recently.  All questions  were answered.    CODE STATUS: DNR  TOTAL TIME TAKING CARE OF THIS PATIENT: 32 minutes.   More than 50% of the time was spent in counseling/coordination of care: YES  POSSIBLE D/C IN 2 DAYS, DEPENDING ON CLINICAL CONDITION.   Corrie Brannen M.D on 05/03/2019 at 12:11 PM  Between 7am to 6pm - Pager - [phone]  After 6pm go to [email or web address] - Proofreader  [facility] [place] [facility]  Office  [phone]  CC: Primary care physician; Hortencia Pilar, MD  Note: This dictation was prepared with Dragon dictation along with smaller phrase technology. Any transcriptional errors that result from this process are unintentional.

## 2019-05-03 NOTE — Progress Notes (Signed)
Around 1415 seizure like activity noted. Bilateral arms stiff, arms jerking for about 10 sec. At that time this Probation officer and pt's daughter held pt's hands. Pt with poor responsiveness throughout the day.  She did not have any loss of consiouseness at that time.  Pt able to nod at times.  Family requested that ativan not be given. Dr Stark Jock made aware and was in pt's room.  Per MD do not give ativan at this time. Pt resting comfortably.

## 2019-05-03 NOTE — Consult Note (Signed)
MEDICATION RELATED CONSULT NOTE - FOLLOW UP   Pharmacy Consult for: Drug interaction and monitoring of antiepileptic medications   Allergies  Allergen Reactions  . Codeine Other (See Comments)    Reaction: unknown  . Omeprazole Other (See Comments)    Other Reaction: gi upset  . Penicillins Itching and Other (See Comments)    Patient was adamant that he has a "strong allergy to penicillins" Did it involve swelling of the face/tongue/throat, SOB, or low BP? Unknown Did it involve sudden or severe rash/hives, skin peeling, or any reaction on the inside of your mouth or nose? Unknown Did you need to seek medical attention at a hospital or doctor's office? Unknown When did it last happen?unknown If all above answers are "NO", may proceed with cephalosporin use.   . Atenolol Other (See Comments)    Other Reaction: DROP IN BP  . Levofloxacin Diarrhea  . Adhesive [Tape] Other (See Comments)    Reaction: unknown  . Albuterol Other (See Comments)    Reaction: unknown  . Atorvastatin Other (See Comments)    Reaction: unknown  . Ciprofloxacin Other (See Comments)    Reaction: unknown  . Diclofenac Other (See Comments)    Reaction: unknown  . Fluoxetine Other (See Comments)    Reaction: unknown  . Indomethacin Other (See Comments)    Reaction: unknown  . Latex Other (See Comments)    Reaction: unknown  . Losartan Other (See Comments)    Reaction: unknown  . Oxybutynin Chloride Other (See Comments)    Reaction: unknown  . Oxycodone-Acetaminophen Other (See Comments)    Reaction: unknown  . Seroquel [Quetiapine Fumarate] Other (See Comments)    Dystonic reaction  . Sucralfate Other (See Comments)    Reaction: unknown  . Tramadol Diarrhea    Pt and daughter report it gives her diarrhea. Usually probiotics are given to her to help per daughter.   . Gabapentin Nausea Only    Patient Measurements: Height: 5\' 3"  (160 cm) Weight: 145 lb (65.8 kg) IBW/kg (Calculated) :  52.4 Adjusted Body Weight: 65.8 kg  Vital Signs: Temp: 98.7 F (37.1 C) (10/30 0612) Temp Source: Axillary (10/30 0612) BP: 137/68 (10/30 0612) Pulse Rate: 108 (10/30 0612) Intake/Output from previous day: No intake/output data recorded. Intake/Output from this shift: No intake/output data recorded.  Labs: Recent Labs    05/01/19 0619 05/03/19 0556  WBC  --  12.3*  HGB  --  14.3  HCT  --  41.9  PLT  --  233  CREATININE 0.64 0.78  MG 2.1  --    Estimated Creatinine Clearance: 41.8 mL/min (by C-G formula based on SCr of 0.78 mg/dL).   Assessment: Currently no significant interactions noted. Currently, neurology following.    Plan:  1. Will continue to monitor for drug interactions with AEDs medications.  Rowland Lathe 05/03/2019,7:47 AM

## 2019-05-04 LAB — CBC
HCT: 42.2 % (ref 36.0–46.0)
Hemoglobin: 14.5 g/dL (ref 12.0–15.0)
MCH: 34.9 pg — ABNORMAL HIGH (ref 26.0–34.0)
MCHC: 34.4 g/dL (ref 30.0–36.0)
MCV: 101.7 fL — ABNORMAL HIGH (ref 80.0–100.0)
Platelets: 211 10*3/uL (ref 150–400)
RBC: 4.15 MIL/uL (ref 3.87–5.11)
RDW: 13.2 % (ref 11.5–15.5)
WBC: 12.9 10*3/uL — ABNORMAL HIGH (ref 4.0–10.5)
nRBC: 0 % (ref 0.0–0.2)

## 2019-05-04 LAB — MAGNESIUM: Magnesium: 2.3 mg/dL (ref 1.7–2.4)

## 2019-05-04 LAB — BASIC METABOLIC PANEL
Anion gap: 11 (ref 5–15)
BUN: 24 mg/dL — ABNORMAL HIGH (ref 8–23)
CO2: 26 mmol/L (ref 22–32)
Calcium: 9.3 mg/dL (ref 8.9–10.3)
Chloride: 104 mmol/L (ref 98–111)
Creatinine, Ser: 0.74 mg/dL (ref 0.44–1.00)
GFR calc Af Amer: >60 mL/min (ref >60)
GFR calc non Af Amer: >60 mL/min (ref >60)
Glucose, Bld: 141 mg/dL — ABNORMAL HIGH (ref 70–99)
Potassium: 3.9 mmol/L (ref 3.5–5.1)
Sodium: 141 mmol/L (ref 135–145)

## 2019-05-04 NOTE — Progress Notes (Signed)
Overnight patient had multiple seizures until dose of ativan was given at 2236. Delay in administration was due to being unable to obtain IV access. Patient had poor veins and IV team had to be consulted for assistance. No further seizures noted after dose of ativan given. Patient was also febrile x2 and received two doses of tylenol PR. Temp and pulse decreased just as first dose was wearing off another dose of tylenol was given. Just prior to shift change temp was normal and hr back to 80-90's. Patient resting comfortably and not in any distress.

## 2019-05-04 NOTE — Progress Notes (Addendum)
[employer] - [place] at [facility]   PATIENT NAME: Yolanda Casey    MR#:  [id number]  DATE OF BIRTH:  01-Jan-1926  SUBJECTIVE:  CHIEF COMPLAINT:   Chief Complaint  Patient presents with  . Seizures   Patient had seizures last night Ativan was given for seizures Patient has decreased responsiveness Not oriented to time place and person  REVIEW OF SYSTEMS:  ROS unobtainable due to underlying dementia and change in mental status  DRUG ALLERGIES:   Allergies  Allergen Reactions  . Codeine Other (See Comments)    Reaction: unknown  . Omeprazole Other (See Comments)    Other Reaction: gi upset  . Penicillins Itching and Other (See Comments)    Patient was adamant that he has a "strong allergy to penicillins" Did it involve swelling of the face/tongue/throat, SOB, or low BP? Unknown Did it involve sudden or severe rash/hives, skin peeling, or any reaction on the inside of your mouth or nose? Unknown Did you need to seek medical attention at a hospital or doctor's office? Unknown When did it last happen?unknown If all above answers are "NO", may proceed with cephalosporin use.   . Atenolol Other (See Comments)    Other Reaction: DROP IN BP  . Levofloxacin Diarrhea  . Adhesive [Tape] Other (See Comments)    Reaction: unknown  . Albuterol Other (See Comments)    Reaction: unknown  . Atorvastatin Other (See Comments)    Reaction: unknown  . Ciprofloxacin Other (See Comments)    Reaction: unknown  . Diclofenac Other (See Comments)    Reaction: unknown  . Fluoxetine Other (See Comments)    Reaction: unknown  . Indomethacin Other (See Comments)    Reaction: unknown  . Latex Other (See Comments)    Reaction: unknown  . Losartan Other (See Comments)    Reaction: unknown  . Oxybutynin Chloride Other (See Comments)    Reaction: unknown  . Oxycodone-Acetaminophen Other (See Comments)    Reaction: unknown  . Seroquel [Quetiapine Fumarate] Other (See  Comments)    Dystonic reaction  . Sucralfate Other (See Comments)    Reaction: unknown  . Tramadol Diarrhea    Pt and daughter report it gives her diarrhea. Usually probiotics are given to her to help per daughter.   . Gabapentin Nausea Only   VITALS:  Blood pressure (!) 140/59, pulse (!) 110, temperature 98.9 F (37.2 C), temperature source Axillary, resp. rate (!) 22, height 5\' 3"  (1.6 m), weight 65.8 kg, SpO2 97 %. PHYSICAL EXAMINATION:  Physical Exam  GENERAL:  85 y.o.-year-old patient lying in the bed EYES: Pupils equal, round, reactive to light and accommodation. No scleral icterus. HEENT: Head atraumatic, normocephalic. Oropharynx and nasopharynx clear. No oropharyngeal erythema, moist oral mucosa  NECK:  Supple, no jugular venous distention. No thyroid enlargement, no tenderness.  LUNGS: Normal breath sounds bilaterally, no wheezing, rales, rhonchi. No use of accessory muscles of respiration.  CARDIOVASCULAR: S1, S2 RRR. No murmurs, rubs, gallops, clicks.  ABDOMEN: Soft, nontender, nondistended. Bowel sounds present.  Patient has colostomy bag in place.  EXTREMITIES: No pedal edema, cyanosis, or clubbing. + 2 pedal & radial pulses b/l.   NEUROLOGIC: Patient is very lethargic.  Full neuro exam not done PSYCHIATRIC: Patient lethargic. SKIN: No obvious rash, lesion, or ulcer.   LABORATORY PANEL:  Female CBC Recent Labs  Lab 05/04/19 0523  WBC 12.9*  HGB 14.5  HCT 42.2  PLT 211   ------------------------------------------------------------------------------------------------------------------ Chemistries  Recent Labs  Lab  04/29/19 1637  05/04/19 0523  NA 139   < > 141  K 3.8   < > 3.9  CL 105   < > 104  CO2 22   < > 26  GLUCOSE 98   < > 141*  BUN 20   < > 24*  CREATININE 0.68   < > 0.74  CALCIUM 9.6   < > 9.3  MG  --    < > 2.3  AST 25  --   --   ALT 32  --   --   ALKPHOS 73  --   --   BILITOT 0.5  --   --    < > = values in this interval not displayed.    RADIOLOGY:  No results found. ASSESSMENT AND PLAN:   85 y.o. female with a known history of dementia, hypertension, hyperlipidemia, history of previous CVA, coronary artery disease, recent admission for seizures who presents to the hospital due to recurrent seizures.  1.  Recurrent seizures-patient was recently hospitalized and thought to have encephalopathy secondary to seizure and discharged on low-dose Keppra.  She had been doing well until the day of admission when she developed multiple episodes of seizures. Patient was seen in the neurologist.  Patient was on Keppra to 250 mg p.o. 2 times daily prior to presentation for history of seizures.  Keppra had been increased to 500 mg twice daily. Dose of Keppra further increased to 1000 mg twice daily.  Continue seizure precautions. EEG done revealed an abnormal EEG secondary to posterior background slowing.  This finding may be seen with a diffuse gray matter disturbance that is etiologically nonspecific, but may include a dementia, among other possibilities.  No epileptiform activity is noted.  Last night patient again had seizures and Ativan was given Nursing home stating patient needs to be without seizures for 48 hours before accepted Family has decided on focusing on keeping patient comfortable going forward. Palliative care consult placed   2.  History of dementia-patient is high risk for sundowning. -Stable   3.  Essential hypertension-continue Toprol, ramipril.  4.  Depression-continue Cymbalta.  5 long-term prognosis poor  6.Discussed with patient family members regarding medical condition and treatment plan. Family waitng on another family member to make decision regarding complete comfort care Palliative care consult pending  DVT prophylaxis; Lovenox Family aware of medical condition and treatment plan  All the records are reviewed and case discussed with Care Management/Social Worker.  All questions were answered.     CODE STATUS: DNR  TOTAL TIME TAKING CARE OF THIS PATIENT: 35 minutes.   More than 50% of the time was spent in counseling/coordination of care: YES  POSSIBLE D/C IN 2 DAYS, DEPENDING ON CLINICAL CONDITION.   Saundra Shelling M.D on 05/04/2019 at 11:30 AM  Between 7am to 6pm - Pager - [phone]  After 6pm go to [email or web address] - Proofreader  [facility]  Office  [phone]  CC: Primary care physician; Hortencia Pilar, MD  Note: This dictation was prepared with Dragon dictation along with smaller phrase technology. Any transcriptional errors that result from this process are unintentional.

## 2019-05-04 NOTE — Progress Notes (Addendum)
Discussed with patient family members regarding medical condition and treatment plan. Currently on keppra for seizures. Family waitng on another family member to make decision regarding complete comfort care Palliative care consult pending. Updated RN taking care of patient

## 2019-05-05 DIAGNOSIS — F0390 Unspecified dementia without behavioral disturbance: Secondary | ICD-10-CM

## 2019-05-05 MED ORDER — LORAZEPAM 2 MG/ML IJ SOLN: 0.5000 mg | Freq: Four times a day (QID) | INTRAMUSCULAR | Status: DC | PRN

## 2019-05-05 MED ORDER — MORPHINE SULFATE (PF) 2 MG/ML IV SOLN
0.5000 mg | INTRAVENOUS | Status: DC | PRN
  Administered 2019-05-06 (×2): 1 mg via INTRAVENOUS
  Administered 2019-05-06: 0.5 mg via INTRAVENOUS
  Filled 2019-05-05 (×3): qty 1

## 2019-05-05 NOTE — TOC Progression Note (Addendum)
Transition of Care [facility]) - Progression Note    Patient Details  Name: Yolanda Casey MRN: [id number] Date of Birth: Aug 08, 1925  Transition [facility] Care [facility]) CM/SW Contact  Katrina Stack, RN Phone Number: 05/05/2019, 3:14 PM  Clinical Narrative:   Patient has been made comfort care today with anticipation of transition to residential hospice. Spoke with daughter [name]. Agency preference is [facility]/ [facility]. [name] is waiting to speak with palliative care.  A consult was placed 10/30 but is pending. Placed referral with [facility] and faxed information.  Per Zenon Mayo at the facility, patient will not require another COVID test.  Negative results from 10/26. If bed is available and patient meets criteria, would anticipate discharge 11/2 to [place]. Notified [facility].    Expected Discharge Plan: [place] Barriers to Discharge: Continued Medical Work up  Expected Discharge Plan and Services Expected Discharge Plan: [place]   Discharge Planning Services: CM Consult   Living arrangements for the past 2 months: Single Family Home                                       Social Determinants of Health (SDOH) Interventions    Readmission Risk Interventions Readmission Risk Prevention Plan 04/20/2019  Transportation Screening Complete  PCP or Specialist Appt within 5-7 Days Complete  Home Care Screening Complete  Medication Review (RN CM) Complete  Some recent data might be hidden

## 2019-05-05 NOTE — Progress Notes (Signed)
PROGRESS NOTE  Yolanda Casey IZT:[id number] DOB: 1925/12/31 DOA: 04/29/2019 PCP: Rolm Gala, MD  Brief History   85 year old woman PMH dementia, CVA, CAD, recent admission for seizures, presented with recurrent seizures.  Admitted for recurrent seizures.  Keppra was increased per neurology.  Patient continued to have seizures.  A & P  Recurrent seizures, known seizure disorder, with associated postictal encephalopathy --EEG was abnormal with posterior background slowing, etiology nonspecific, may include dementia.  No epileptiform activity noted. --Seen by neurology October 27, recommendation was to continue Keppra at 500 mg twice daily, can increase as needed.  Dose has been subsequently increased to 1 g twice daily. --No significant improvement noted.  Multiple children at bedside report they feel she is at end-of-life and they request comfort measures and transition to residential hospice.  They report she was fiercely independent but developing dementia and with onset of seizures approximately 2 months ago her independence and quality of life are rapidly declined.  Patient is not safe to eat or drink at this point. --Given her second significant decline, appears to be appropriate for residential hospice.  PMH stroke  Colostomy --Continue routine care  Dementia --Comfort care  . Discussed in detail as above with family members.  All questions answered.  Will pursue comfort measures and transition to residential hospice.  DVT prophylaxis: not indicated Code Status: DNR/DNI Family Communication: as above Disposition Plan: residential hospice   Brendia Sacks, MD  [facility] Direct contact: see [email or web address] (further directions at bottom of note if needed) 7PM-7AM contact night coverage as at bottom of note 05/05/2019, 11:49 AM  LOS: 6 days   Significant Hospital Events   . 10/26 admitted for recurrent seizures . 10/27 seen by neurology . 10/30 continues to have  seizures . 11/1 discussion with family, they wish to pursue full comfort care and transition to residential hospice   Consults:  . Neurology . Palliative medicine pending   Procedures:  .   Significant Diagnostic Tests:  . 10/16 prior to admission: MRI brain no acute intracranial abnormality . 10/16 CTA head and neck no significant finding noted. . 10/27 EEG: abnormal EEG secondary to posterior background slowing.  This finding may be seen with a diffuse gray matter disturbance that is etiologically nonspecific, but may include a dementia, among other possibilities.  No epileptiform activity is noted.     Micro Data:  . SARS Covid negative . Urinalysis unremarkable   Antimicrobials:  .   Interval History/Subjective  No seizures this morning per RN. 5 children at bedside.  Reports she is less responsive than yesterday.  Objective   Vitals:  Vitals:   05/05/19 0418 05/05/19 1035  BP: (!) 135/56 (!) 146/58  Pulse: 99 98  Resp: 19 18  Temp: 98.3 F (36.8 C) 98.9 F (37.2 C)  SpO2: 99% 96%    Exam:  Constitutional: Appears calm, comfortable, ill but nontoxic ENT: Hearing appears to be grossly normal Respiratory: Clear to auscultation bilaterally.  No wheezes, rales or rhonchi.  Normal respiratory effort. Cardiovascular: Regular rate and rhythm.  No murmur, rub or gallop.  No lower extremity edema. Abdomen: Soft, nontender, nondistended.  Left lower quadrant colostomy in place.  Stoma appears pink. Skin: Limited exam appears grossly unremarkable. Neurologic: Occasionally responds or voices spontaneously a few words.  She does not follow commands.  She appears confused. Psychiatric: Not able to assess mood or affect.  I have personally reviewed the following:   Today's Data  . No new data  Scheduled Meds: . aspirin EC  81 mg Oral Daily  . mouth rinse  15 mL Mouth Rinse BID   Continuous Infusions: . sodium chloride 250 mL (05/05/19 1141)  . levETIRAcetam 1,000 mg  (05/05/19 1142)    Principal Problem:   Recurrent seizures ([place]) Active Problems:   Dementia ([place])   LOS: 6 days   How to contact the [facility] Attending or Consulting provider [place] or covering provider during after hours [place], for this patient?  1. Check the care team in [facility] and look for a) attending/consulting [facility] provider listed and b) the [facility] team listed 2. Log into [email or web address] and use [place]'s universal password to access. If you do not have the password, please contact the hospital operator. 3. Locate the [facility] provider you are looking for under [facility] and page to a number that you can be directly reached. 4. If you still have difficulty reaching the provider, please page the [facility] (Director on Call) for the Hospitalists listed on amion for assistance.

## 2019-05-05 NOTE — Progress Notes (Signed)
This Probation officer checked with Dr Sarajane Jews if we can d/c tele and continuous pulse ox? Per MD to d/c tele and continuous pulse ox and to order NMP.

## 2019-05-06 DIAGNOSIS — G40909 Epilepsy, unspecified, not intractable, without status epilepticus: Principal | ICD-10-CM

## 2019-05-06 DIAGNOSIS — Z515 Encounter for palliative care: Secondary | ICD-10-CM

## 2019-05-06 MED ORDER — GLYCOPYRROLATE 0.2 MG/ML IJ SOLN: 0.2000 mg | INTRAMUSCULAR | Status: DC | PRN

## 2019-05-06 MED ORDER — LEVETIRACETAM 1000 MG PO TABS: 1000.0000 mg | ORAL_TABLET | Freq: Two times a day (BID) | ORAL | Status: AC

## 2019-05-06 NOTE — Discharge Summary (Signed)
Physician Discharge Summary  Yolanda Casey ZOX:[id number] DOB: Dec 25, 1925 DOA: 04/29/2019  PCP: Rolm Gala, MD  Admit date: 04/29/2019 Discharge date: 05/06/2019  Discharge to residential hospice  Discharge Diagnoses: Principal diagnosis is #1 1. Recurrent seizures, known seizure disorder, with associated postictal encephalopathy 2. If possible can administer Keppra 1 g p.o. twice daily.  However it does not appear that this will be possible. 3. Medications for comfort per hospice. 4. Dementia  Discharge Condition: stable but short-term prognosis poor Disposition: residential hospice  Diet recommendation: as desired  Filed Weights   04/29/19 1529  Weight: 65.8 kg    History of present illness:  85 year old woman PMH dementia, CVA, CAD, recent admission for seizures, presented with recurrent seizures.  Admitted for recurrent seizures.   Hospital Course:  Patient was admitted and seen by neurology, antiseizure medication dosage was adjusted without significant improvement.  She continued to have intermittent seizures who was unable to maintain hydration or nutrition.  After further discussion with family, they noted her quality of life had significantly deteriorated over the last several months since her seizures had started.  She used to be independent but after each seizure has significant decline for which she did not recover.  Seeing her in her current state, they have requested full comfort care.  Patient did not have significant recovery after her seizures and seizure medication appear to be ineffective.  Based on the patient's chronic illness and worsening over time, as well as lack of recovery in the hospital, full comfort care was pursued with transition to residential hospice.  Recurrent seizures, known seizure disorder, with associated postictal encephalopathy --EEG was abnormal with posterior background slowing, etiology nonspecific, may include dementia.  No  epileptiform activity noted. --Seen by neurology October 27, recommendation was to continue Keppra at 500 mg twice daily, can increase as needed.  Dose has been subsequently increased to 1 g twice daily. --No significant improvement noted.  Multiple children at bedside report they feel she is at end-of-life and they requested comfort measures and transition to residential hospice.  They report she was fiercely independent but developing dementia and with onset of seizures approximately 2 months ago her independence and quality of life are rapidly declined.  Patient is not safe to eat or drink at this point. --Given her second significant decline, and lack of ability to maintain nutrition, hospice is appropriate.  PMH stroke  Colostomy --Continue routine care  Dementia --Comfort care  Significant Hospital Events    10/26 admitted for recurrent seizures  10/27 seen by neurology  10/30 continues to have seizures  11/1 discussion with family, they wish to pursue full comfort care and transition to residential hospice  Consults:   Neurology  Palliative medicine pending  Procedures:     Significant Diagnostic Tests:   10/16 prior to admission: MRI brain no acute intracranial abnormality  10/16 CTA head and neck no significant finding noted.  10/27 EEG: abnormal EEG secondary to posterior background slowing. This finding may be seen with a diffuse gray matter disturbance that is etiologically nonspecific, but may include a dementia, among other possibilities. No epileptiform activity is noted.  Micro Data:   SARS Covid negative  Urinalysis unremarkable  Today's assessment: S: Resting comfortably today. O: Vitals:  Vitals:   05/06/19 0400 05/06/19 0917  BP: 138/66 (!) 141/75  Pulse: 89 96  Resp: 16 18  Temp: (!) 97.3 F (36.3 C) (!) 97.5 F (36.4 C)  SpO2: 95% 95%    Constitutional: Appears  calm, comfortable. Respiratory.  Clear to auscultation  bilaterally.  No wheezes, rales or rhonchi.  Normal respiratory effort. Cardiovascular.  Regular rate and rhythm.  No murmur, rub or gallop.  No lower extremity edema. Psychiatric: Does not follow commands.  Briefly arouses to voice.   Discharge Instructions   Allergies as of 05/06/2019      Reactions   Codeine Other (See Comments)   Reaction: unknown   Omeprazole Other (See Comments)   Other Reaction: gi upset   Penicillins Itching, Other (See Comments)   Patient was adamant that he has a "strong allergy to penicillins" Did it involve swelling of the face/tongue/throat, SOB, or low BP? Unknown Did it involve sudden or severe rash/hives, skin peeling, or any reaction on the inside of your mouth or nose? Unknown Did you need to seek medical attention at a hospital or doctor's office? Unknown When did it last happen?unknown If all above answers are NO, may proceed with cephalosporin use.   Atenolol Other (See Comments)   Other Reaction: DROP IN BP   Levofloxacin Diarrhea   Adhesive [tape] Other (See Comments)   Reaction: unknown   Albuterol Other (See Comments)   Reaction: unknown   Atorvastatin Other (See Comments)   Reaction: unknown   Ciprofloxacin Other (See Comments)   Reaction: unknown   Diclofenac Other (See Comments)   Reaction: unknown   Fluoxetine Other (See Comments)   Reaction: unknown   Indomethacin Other (See Comments)   Reaction: unknown   Latex Other (See Comments)   Reaction: unknown   Losartan Other (See Comments)   Reaction: unknown   Oxybutynin Chloride Other (See Comments)   Reaction: unknown   Oxycodone-acetaminophen Other (See Comments)   Reaction: unknown   Seroquel [quetiapine Fumarate] Other (See Comments)   Dystonic reaction   Sucralfate Other (See Comments)   Reaction: unknown   Tramadol Diarrhea   Pt and daughter report it gives her diarrhea. Usually probiotics are given to her to help per daughter.    Gabapentin Nausea Only        Medication List    STOP taking these medications   aspirin EC 81 MG tablet   DULoxetine 20 MG capsule Commonly known as: CYMBALTA   QUEtiapine 50 MG tablet Commonly known as: SEROQUEL   ramipril 5 MG capsule Commonly known as: ALTACE   Toprol XL 25 MG 24 hr tablet Generic drug: metoprolol succinate   TYLENOL 500 MG tablet Generic drug: acetaminophen     TAKE these medications   levETIRAcetam 1000 MG tablet Commonly known as: Keppra Take 1 tablet (1,000 mg total) by mouth 2 (two) times daily. What changed:   medication strength  how much to take      Allergies  Allergen Reactions   Codeine Other (See Comments)    Reaction: unknown   Omeprazole Other (See Comments)    Other Reaction: gi upset   Penicillins Itching and Other (See Comments)    Patient was adamant that he has a "strong allergy to penicillins" Did it involve swelling of the face/tongue/throat, SOB, or low BP? Unknown Did it involve sudden or severe rash/hives, skin peeling, or any reaction on the inside of your mouth or nose? Unknown Did you need to seek medical attention at a hospital or doctor's office? Unknown When did it last happen?unknown If all above answers are NO, may proceed with cephalosporin use.    Atenolol Other (See Comments)    Other Reaction: DROP IN BP   Levofloxacin Diarrhea  Adhesive [Tape] Other (See Comments)    Reaction: unknown   Albuterol Other (See Comments)    Reaction: unknown   Atorvastatin Other (See Comments)    Reaction: unknown   Ciprofloxacin Other (See Comments)    Reaction: unknown   Diclofenac Other (See Comments)    Reaction: unknown   Fluoxetine Other (See Comments)    Reaction: unknown   Indomethacin Other (See Comments)    Reaction: unknown   Latex Other (See Comments)    Reaction: unknown   Losartan Other (See Comments)    Reaction: unknown   Oxybutynin Chloride Other (See Comments)    Reaction: unknown    Oxycodone-Acetaminophen Other (See Comments)    Reaction: unknown   Seroquel [Quetiapine Fumarate] Other (See Comments)    Dystonic reaction   Sucralfate Other (See Comments)    Reaction: unknown   Tramadol Diarrhea    Pt and daughter report it gives her diarrhea. Usually probiotics are given to her to help per daughter.    Gabapentin Nausea Only    The results of significant diagnostics from this hospitalization (including imaging, microbiology, ancillary and laboratory) are listed below for reference.    Significant Diagnostic Studies: Ct Angio Head W Or Wo Contrast  Result Date: 04/19/2019 CLINICAL DATA:  Seizure-like activity. Mental status changes. Dementia. EXAM: CT ANGIOGRAPHY HEAD AND NECK TECHNIQUE: Multidetector CT imaging of the head and neck was performed using the standard protocol during bolus administration of intravenous contrast. Multiplanar CT image reconstructions and MIPs were obtained to evaluate the vascular anatomy. Carotid stenosis measurements (when applicable) are obtained utilizing NASCET criteria, using the distal internal carotid diameter as the denominator. CONTRAST:  65mL OMNIPAQUE IOHEXOL 350 MG/ML SOLN COMPARISON:  Head CT same day FINDINGS: CT HEAD FINDINGS Brain: Generalized atrophy as seen previously. Chronic small-vessel ischemic changes of the white matter. Ventriculomegaly due to central atrophy. No sign of acute infarction, mass lesion, hemorrhage, hydrocephalus or extra-axial collection. Vascular: There is atherosclerotic calcification of the major vessels at the base of the brain. Skull: Negative Sinuses: Clear Orbits: Normal CTA NECK FINDINGS Aortic arch: Aortic atherosclerosis without aneurysm or dissection. Branching pattern is normal without origin stenosis. Right carotid system: Common carotid artery is widely patent to the bifurcation region. Calcified plaque at the carotid bifurcation and ICA bulb. Minimal diameter is 4 mm. Compared to a more  distal cervical ICA diameter of 4 mm, there is no stenosis. Left carotid system: Common carotid artery widely patent to the bifurcation. Calcified plaque at the carotid bifurcation and ICA bulb. Minimal diameter in the bulb is 4 mm. Compared to a more distal cervical ICA diameter of 4 mm, there is no stenosis. Vertebral arteries: Both vertebral artery origins show adjacent calcified plaque but there is no stenosis. Both vertebral arteries are patent through the cervical region to the foramen magnum. Skeleton: Ordinary cervical spondylosis. Other neck: No mass or lymphadenopathy. Upper chest: No active disease. Review of the MIP images confirms the above findings CTA HEAD FINDINGS Anterior circulation: Both internal carotid arteries are patent through the skull base and siphon regions. There is siphon atherosclerotic calcification but no stenosis greater than 30%. The anterior and middle cerebral vessels are patent without proximal stenosis, aneurysm or vascular malformation. No large or medium vessel occlusion is identified. Posterior circulation: Both vertebral arteries are patent through the foramen magnum to the basilar. There is some calcification in the V4 segments but without flow limiting stenosis. No basilar stenosis. Posterior circulation branch vessels are patent. Venous sinuses:  Patent and normal. Anatomic variants: None significant. Review of the MIP images confirms the above findings IMPRESSION: 1. No significant finding by CT. Atrophy and chronic small-vessel ischemic changes. 2. No intracranial large or medium vessel occlusion or correctable proximal stenosis. 3. Atherosclerotic disease at both carotid bifurcations but without measurable stenosis. Electronically Signed   By: Paulina Fusi M.D.   On: 04/19/2019 22:23   Ct Head Wo Contrast  Result Date: 04/19/2019 CLINICAL DATA:  84 year old female with altered mental status. EXAM: CT HEAD WITHOUT CONTRAST TECHNIQUE: Contiguous axial images were  obtained from the base of the skull through the vertex without intravenous contrast. COMPARISON:  Head CT dated 02/28/2019 FINDINGS: Brain: There is moderate age-related atrophy and chronic microvascular ischemic changes. Stable dilatation of the lateral and third ventricles similar to prior CT. There is no acute intracranial hemorrhage. No mass effect or midline shift. No extra-axial fluid collection. Vascular: No hyperdense vessel or unexpected calcification. Skull: Normal. Negative for fracture or focal lesion. Sinuses/Orbits: No acute finding. Other: None IMPRESSION: 1. No acute intracranial hemorrhage. 2. Stable atrophy and ventricular dilatation similar to prior CT. Electronically Signed   By: Elgie Collard M.D.   On: 04/19/2019 19:37   Ct Angio Neck W Or Wo Contrast  Result Date: 04/19/2019 CLINICAL DATA:  Seizure-like activity. Mental status changes. Dementia. EXAM: CT ANGIOGRAPHY HEAD AND NECK TECHNIQUE: Multidetector CT imaging of the head and neck was performed using the standard protocol during bolus administration of intravenous contrast. Multiplanar CT image reconstructions and MIPs were obtained to evaluate the vascular anatomy. Carotid stenosis measurements (when applicable) are obtained utilizing NASCET criteria, using the distal internal carotid diameter as the denominator. CONTRAST:  75mL OMNIPAQUE IOHEXOL 350 MG/ML SOLN COMPARISON:  Head CT same day FINDINGS: CT HEAD FINDINGS Brain: Generalized atrophy as seen previously. Chronic small-vessel ischemic changes of the white matter. Ventriculomegaly due to central atrophy. No sign of acute infarction, mass lesion, hemorrhage, hydrocephalus or extra-axial collection. Vascular: There is atherosclerotic calcification of the major vessels at the base of the brain. Skull: Negative Sinuses: Clear Orbits: Normal CTA NECK FINDINGS Aortic arch: Aortic atherosclerosis without aneurysm or dissection. Branching pattern is normal without origin stenosis.  Right carotid system: Common carotid artery is widely patent to the bifurcation region. Calcified plaque at the carotid bifurcation and ICA bulb. Minimal diameter is 4 mm. Compared to a more distal cervical ICA diameter of 4 mm, there is no stenosis. Left carotid system: Common carotid artery widely patent to the bifurcation. Calcified plaque at the carotid bifurcation and ICA bulb. Minimal diameter in the bulb is 4 mm. Compared to a more distal cervical ICA diameter of 4 mm, there is no stenosis. Vertebral arteries: Both vertebral artery origins show adjacent calcified plaque but there is no stenosis. Both vertebral arteries are patent through the cervical region to the foramen magnum. Skeleton: Ordinary cervical spondylosis. Other neck: No mass or lymphadenopathy. Upper chest: No active disease. Review of the MIP images confirms the above findings CTA HEAD FINDINGS Anterior circulation: Both internal carotid arteries are patent through the skull base and siphon regions. There is siphon atherosclerotic calcification but no stenosis greater than 30%. The anterior and middle cerebral vessels are patent without proximal stenosis, aneurysm or vascular malformation. No large or medium vessel occlusion is identified. Posterior circulation: Both vertebral arteries are patent through the foramen magnum to the basilar. There is some calcification in the V4 segments but without flow limiting stenosis. No basilar stenosis. Posterior circulation branch vessels are  patent. Venous sinuses: Patent and normal. Anatomic variants: None significant. Review of the MIP images confirms the above findings IMPRESSION: 1. No significant finding by CT. Atrophy and chronic small-vessel ischemic changes. 2. No intracranial large or medium vessel occlusion or correctable proximal stenosis. 3. Atherosclerotic disease at both carotid bifurcations but without measurable stenosis. Electronically Signed   By: Paulina FusiMark  Shogry M.D.   On: 04/19/2019  22:23   Mr Brain Wo Contrast  Result Date: 04/19/2019 CLINICAL DATA:  Initial evaluation for acute seizure like activity, confusion. EXAM: MRI HEAD WITHOUT CONTRAST TECHNIQUE: Multiplanar, multiecho pulse sequences of the brain and surrounding structures were obtained without intravenous contrast. COMPARISON:  Prior CT and CTA from earlier same day. FINDINGS: Brain: Generalized age-related cerebral atrophy. Patchy and confluent T2/FLAIR hyperintensity within the periventricular deep white matter both cerebral hemispheres most consistent with chronic small vessel ischemic disease, moderate in nature. No abnormal foci of restricted diffusion to suggest acute or subacute ischemia. Tiny focus of apparent diffusion signal abnormality involving the central aspect of the splenium on axial DWI image 29 favored to be artifactual nature, with no signal abnormality seen on corresponding FLAIR sequence. Gray-white matter differentiation maintained without evidence for chronic cortical infarction. No acute intracranial hemorrhage. Single chronic microhemorrhage noted at the subcortical right frontal lobe, of doubtful significance in isolation. No mass lesion, midline shift or mass effect. No intrinsic temporal lobe abnormality. Diffuse ventricular prominence related global parenchymal volume loss of hydrocephalus. No extra-axial fluid collection. Pituitary gland suprasellar region within normal limits. Midline structures intact. Vascular: Major intracranial vascular flow voids are maintained. Skull and upper cervical spine: Craniocervical junction within normal limits. Bone marrow signal intensity normal. No scalp soft tissue abnormality. Sinuses/Orbits: Patient status post bilateral ocular lens replacement. Globes orbital soft tissues demonstrate no acute finding. Mild scattered mucosal thickening noted within the ethmoidal air cells. Paranasal sinuses are otherwise clear. No mastoid effusion. Inner ear structures normal.  Other: None. IMPRESSION: 1. No acute intracranial abnormality. 2. Generalized age-related cerebral atrophy with moderate chronic small vessel ischemic disease. Electronically Signed   By: Rise MuBenjamin  McClintock M.D.   On: 04/19/2019 23:20    Microbiology: Recent Results (from the past 240 hour(s))  SARS CORONAVIRUS 2 (TAT 6-24 HRS) Nasopharyngeal Nasopharyngeal Swab     Status: None   Collection Time: 04/29/19  4:38 PM   Specimen: Nasopharyngeal Swab  Result Value Ref Range Status   SARS Coronavirus 2 NEGATIVE NEGATIVE Final    Comment: (NOTE) SARS-CoV-2 target nucleic acids are NOT DETECTED. The SARS-CoV-2 RNA is generally detectable in upper and lower respiratory specimens during the acute phase of infection. Negative results do not preclude SARS-CoV-2 infection, do not rule out co-infections with other pathogens, and should not be used as the sole basis for treatment or other patient management decisions. Negative results must be combined with clinical observations, patient history, and epidemiological information. The expected result is Negative. Fact Sheet for Patients: HairSlick.nohttps://www.fda.gov/media/138098/download Fact Sheet for Healthcare Providers: quierodirigir.comhttps://www.fda.gov/media/138095/download This test is not yet approved or cleared by the [place] FDA and  has been authorized for detection and/or diagnosis of SARS-CoV-2 by FDA under an Emergency Use Authorization (EUA). This EUA will remain  in effect (meaning this test can be used) for the duration of the COVID-19 declaration under Section 56 4(b)(1) of the Act, 21 U.S.C. section 360bbb-3(b)(1), unless the authorization is terminated or revoked sooner. Performed at [facility], [address] [place], [place] [address]      Labs: Basic  Metabolic Panel: Recent Labs  Lab 04/29/19 1637 04/30/19 0415 05/01/19 0619 05/03/19 0556 05/04/19 0523  NA 139 141 138 137 141  K 3.8 3.2* 3.8 3.8 3.9  CL 105 107 101 100  104  CO2 GLUCOSE 98 103* 123* 147* 141*  BUN 25* 24*  CREATININE 0.68 0.65 0.64 0.78 0.74  CALCIUM 9.6 9.2 9.5 9.5 9.3  MG  --  2.2 2.1  --  2.3   Liver Function Tests: Recent Labs  Lab 04/29/19 1637  AST 25  ALT 32  ALKPHOS 73  BILITOT 0.5  PROT 7.4  ALBUMIN 4.1   CBC: Recent Labs  Lab 04/29/19 1637 04/30/19 0415 05/03/19 0556 05/04/19 0523  WBC 8.1 7.6 12.3* 12.9*  NEUTROABS 3.1  --   --   --   HGB 13.7 13.5 14.3 14.5  HCT 41.3 41.0 41.9 42.2  MCV 102.0* 102.8* 100.5* 101.7*  PLT 211 207 233 211    CBG: Recent Labs  Lab 05/02/19 1050  GLUCAP 115*    Principal Problem:   Recurrent seizures (HCC) Active Problems:   Dementia [facility])   Palliative care by specialist   Terminal care   Time coordinating discharge: 35 minutes  Signed:  Brendia Sacks, MD  [facility]  05/06/2019, 1:22 PM

## 2019-05-06 NOTE — Progress Notes (Signed)
Visit made to new referral for [facility] hospice home. Patient seen lying in bed, eyes closed, appeared to be somewhat restless and grimacing. Daughter [name] at bedside and reports patient just received some IV morphine. Writer then met with [name] and her brother Legrand Como to initiate education regarding hospice home services, philosophy, team approach to care and current visitation policy with understanding voiced. Questions answered. [name] and Legrand Como will meet with hospice social worker this afternoon at the hospice home to sign consents. EMS to be arranged by writer for 5 pm pick up. Report called to the Hospice home, Hospital care team updated. Will continue to follow through discharge. Flo Shanks BSN, RN, [facility] [phone]

## 2019-05-06 NOTE — Progress Notes (Signed)
Handoff report given to EMS prior to transport to [facility]. Family with patient.

## 2019-05-06 NOTE — Progress Notes (Signed)
Daily Progress Note   Patient Name: Yolanda Casey       Date: 05/06/2019 DOB: [birth year]  Age: 85 y.o. MRN#: [id number] Attending Physician: Standley Brooking, MD Primary Care Physician: Rolm Gala, MD Admit Date: 04/29/2019  Reason for Consultation/Follow-up: Establishing goals of care and Terminal Care  Subjective/GOC: Patient lethargic but appears comfortable. No s/s of distress or discomfort. No family at bedside yet this morning.   Spoke with daughter, Dois Davenport via telephone to confirm plan of care. Discussed hospice philosophy and comfort focused care, emphasizing interventions/medications not aimed at comfort will be discontinued at hospice facility. Discussed comfort medications and comfort feeds.   Emotional support provided. Dois Davenport states "I know what mom wants" and "we want her comfortable." Quality of life has been poor since progression of dementia.   Answered questions and concerns and reassured daughter that hospice liaison will follow this afternoon and discuss hospice facility and bed availability.   Length of Stay: 7  Current Medications: Scheduled Meds:  . mouth rinse  15 mL Mouth Rinse BID    Continuous Infusions: . sodium chloride 250 mL (05/05/19 1141)  . levETIRAcetam 1,000 mg (05/05/19 2127)    PRN Meds: sodium chloride, acetaminophen **OR** acetaminophen, glycopyrrolate, haloperidol lactate, LORazepam, LORazepam, morphine injection, ondansetron **OR** ondansetron (ZOFRAN) IV  Physical Exam Vitals signs and nursing note reviewed.  Constitutional:      Appearance: She is cachectic. She is ill-appearing.  HENT:     Head: Normocephalic and atraumatic.  Cardiovascular:     Rate and Rhythm: Normal rate.  Pulmonary:     Effort: No tachypnea, accessory  muscle usage or respiratory distress.     Breath sounds: Normal breath sounds.  Skin:    General: Skin is warm and dry.  Neurological:     Mental Status: She is lethargic.  Psychiatric:        Attention and Perception: She is inattentive.        Cognition and Memory: Cognition is impaired.            Vital Signs: BP (!) 141/75 (BP Location: Right Arm)   Pulse 96   Temp (!) 97.5 F (36.4 C) (Axillary)   Resp 18   Ht 5\' 3"  (1.6 m)   Wt 65.8 kg   SpO2 95%   BMI 25.69 kg/m  SpO2: SpO2: 95 % O2 Device: O2 Device: Room Air O2 Flow Rate: O2 Flow Rate (L/min): 2 L/min  Intake/output summary:   Intake/Output Summary (Last 24 hours) at 05/06/2019 0925 Last data filed at 05/06/2019 8786 Gross per 24 hour  Intake 0 ml  Output 700 ml  Net -700 ml   LBM: Last BM Date: 05/05/19 Baseline Weight: Weight: 65.8 kg Most recent weight: Weight: 65.8 kg       Palliative Assessment/Data: PPS 20%      Patient Active Problem List   Diagnosis Date Noted  . Recurrent seizures ([place]) 04/29/2019  . HTN (hypertension) 04/19/2019  . HLD (hyperlipidemia) 04/19/2019  . Dementia ([place]) 04/19/2019  . CAD (coronary artery disease) 04/19/2019  . Seizure ([place]) 04/19/2019  . Lumbar degenerative disc disease 12/29/2016  . Spinal stenosis 12/29/2016  . Pyuria, sterile 12/29/2016  . Hyperglycemia 12/29/2016  . Generalized weakness 12/29/2016  . Back pain 12/28/2016    Palliative Care Assessment & Plan   Patient Profile: Yolanda Casey is a 85yo woman with past medical history of dementia, CVA, CAD, recent admission for seizures. Hospital admission for recurrent seizures. Keppra was increased by neurology. Patient remains with postictal encephalopathy with underlying dementia. Poor functional/nutritional/cognitive status. Family decision made on 05/05/19 to pursue comfort measures only. Palliative medicine consultation for goals of care.   Assessment: Recurrent seizures Postictal encephalopathy  Dementia Hx of CVA  Recommendations/Plan:  Comfort focused care. Interventions not aimed at comfort have been discontinued.   Continue comfort medications prn.   Comfort feeds per patient/family request.  SW following for residential hospice placement. Hospice liaison will see this afternoon. Pending bed availability.   Code Status: DNR   Code Status Orders  (From admission, onward)         Start     Ordered   04/29/19 2227  Do not attempt resuscitation (DNR)  Continuous    Question Answer Comment  In the event of cardiac or respiratory ARREST Do not call a "code blue"   In the event of cardiac or respiratory ARREST Do not perform Intubation, CPR, defibrillation or ACLS   In the event of cardiac or respiratory ARREST Use medication by any route, position, wound care, and other measures to relive pain and suffering. May use oxygen, suction and manual treatment of airway obstruction as needed for comfort.      04/29/19 2227        Code Status History    Date Active Date Inactive Code Status Order ID Comments User Context   04/20/2019 0010 04/20/2019 1613 Full Code [id number]  Lance Coon, MD Inpatient   12/28/2016 0537 12/29/2016 1859 Full Code [id number]  Saundra Shelling, MD Inpatient   Advance Care Planning Activity       Prognosis:   Likely weeks: Patient remains postictal following recurrent seizures. Also with underlying dementia with severe decline in functional/cognitive/nutritional status.   Discharge Planning:  Hospice facility  Care plan was discussed with daughter [name]), RN, hospice liaison, SW  Thank you for allowing the Palliative Medicine Team to assist in the care of this patient.   Time In: 0900 Time Out: 0920 Total Time 20 Prolonged Time Billed no       Greater than 50%  of this time was spent counseling and coordinating care related to the above assessment and plan.  Ihor Dow, DNP, FNP-C Palliative Medicine Team  Phone: [phone]  Fax: [phone]  Please contact Palliative Medicine Team phone at [phone] for questions and concerns.

## 2019-05-06 NOTE — TOC Transition Note (Signed)
Transition of Care [facility]) - CM/SW Discharge Note   Patient Details  Name: Yolanda Casey MRN: [id number] Date of Birth: 1926/04/08  Transition of Care [facility]) CM/SW Contact:  Sanela Evola, Lenice Llamas Phone Number: [phone]  05/06/2019, 2:34 PM   Clinical Narrative: Per [facility] liaison they can accept patient today to the [facility] in [place]. Clinical Education officer, museum (CSW) completed EMS form. RN and MD aware of above. Please reconsult if future social work needs arise. CSW signing off.      Final next level of care: [place] Barriers to Discharge: Barriers Resolved   Patient Goals and CMS Choice   CMS Medicare.gov Compare Post Acute Care list provided to:: Patient Represenative (must comment)(daughter) Choice offered to / list presented to : Adult Children  Discharge Placement              Patient chooses bed at: Other - please specify in the comment section below:([facility].) Patient to be transferred to facility by: [facility] Name of family member notified: Patient's daughter [name] is aware of D/C today. Patient and family notified of of transfer: 05/06/19  Discharge Plan and Services   Discharge Planning Services: CM Consult            DME Arranged: N/A         HH Arranged: NA          Social Determinants of Health (SDOH) Interventions     Readmission Risk Interventions Readmission Risk Prevention Plan 04/20/2019  Transportation Screening Complete  PCP or Specialist Appt within 5-7 Days Complete  Home Care Screening Complete  Medication Review (RN CM) Complete  Some recent data might be hidden

## 2019-05-06 NOTE — Care Management Important Message (Signed)
Important Message  Patient Details  Name: Yolanda Casey MRN: [id number] Date of Birth: Sep 30, 1925   Medicare Important Message Given:  Other (see comment)  Patient on comfort care awaiting bed at [facility]. Out of respect for patient/family no IM given.     Juliann Pulse A Seleta Hovland 05/06/2019, 12:53 PM

## 2019-06-04 DEATH — deceased

## 2020-02-23 IMAGING — CR DG CHEST 2V
1 series · 3 of 3 positions shown · non-contrast
Comparison: 02/10/2019

CLINICAL DATA: Chest pain.

EXAM:
CHEST - 2 VIEW

[Series 1: dg chest 2 view · 0.14mm/px · 3 of 3 slices shown]
[im 1/3]
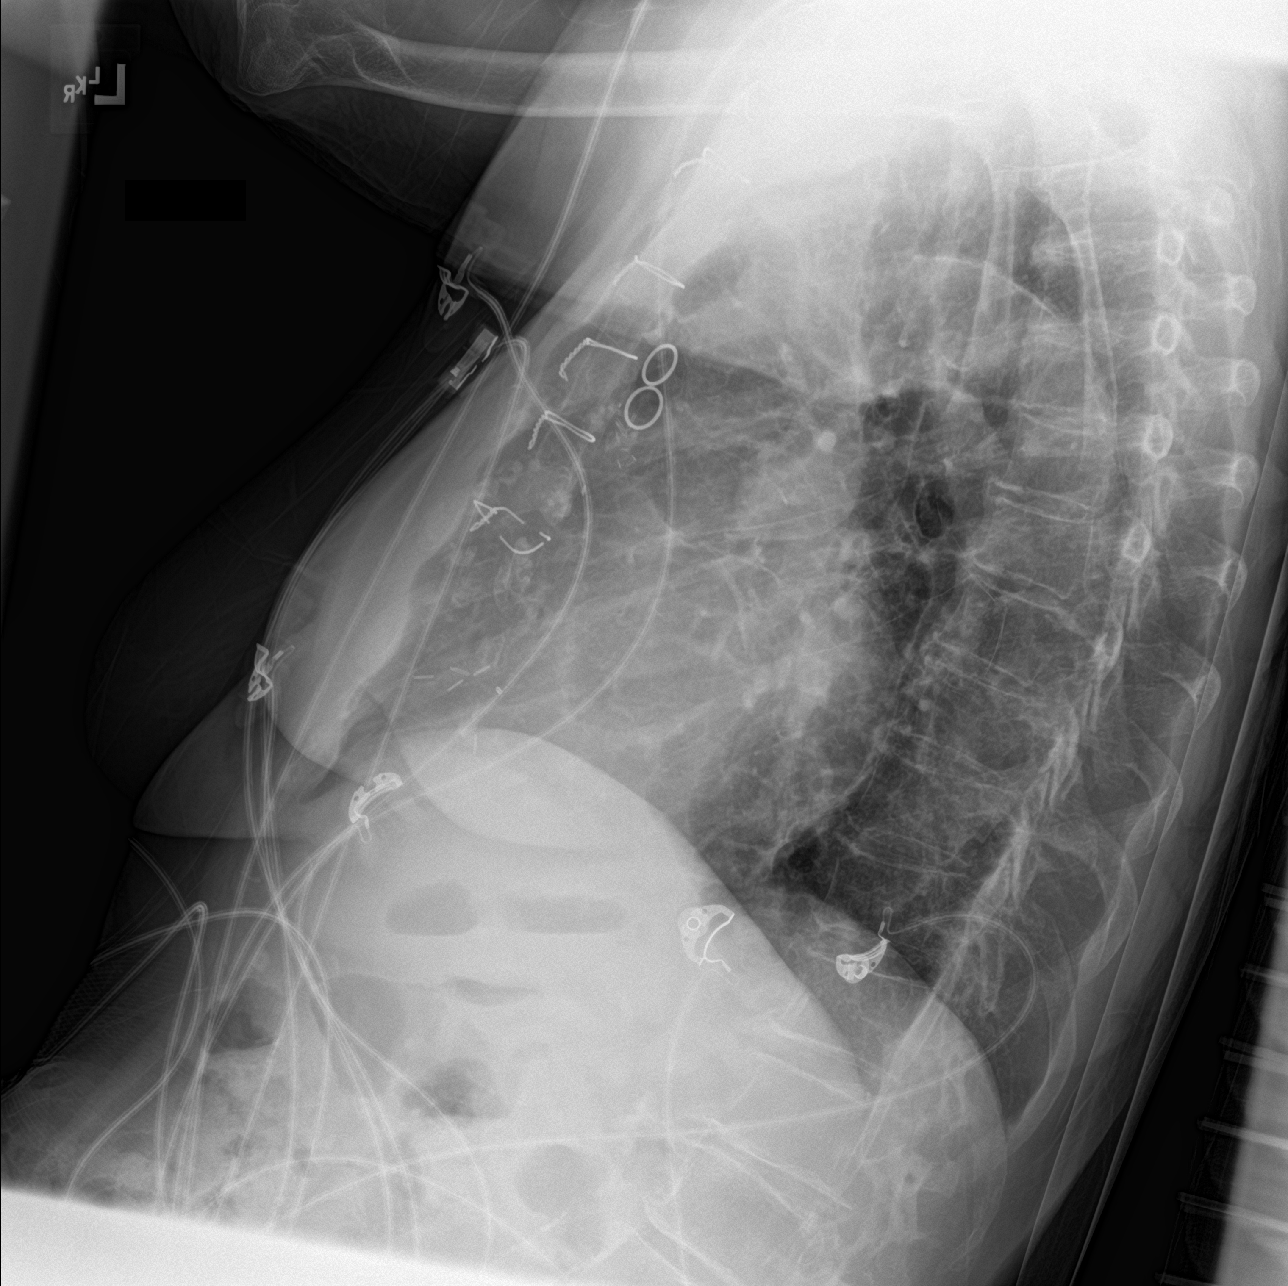
[im 2/3]
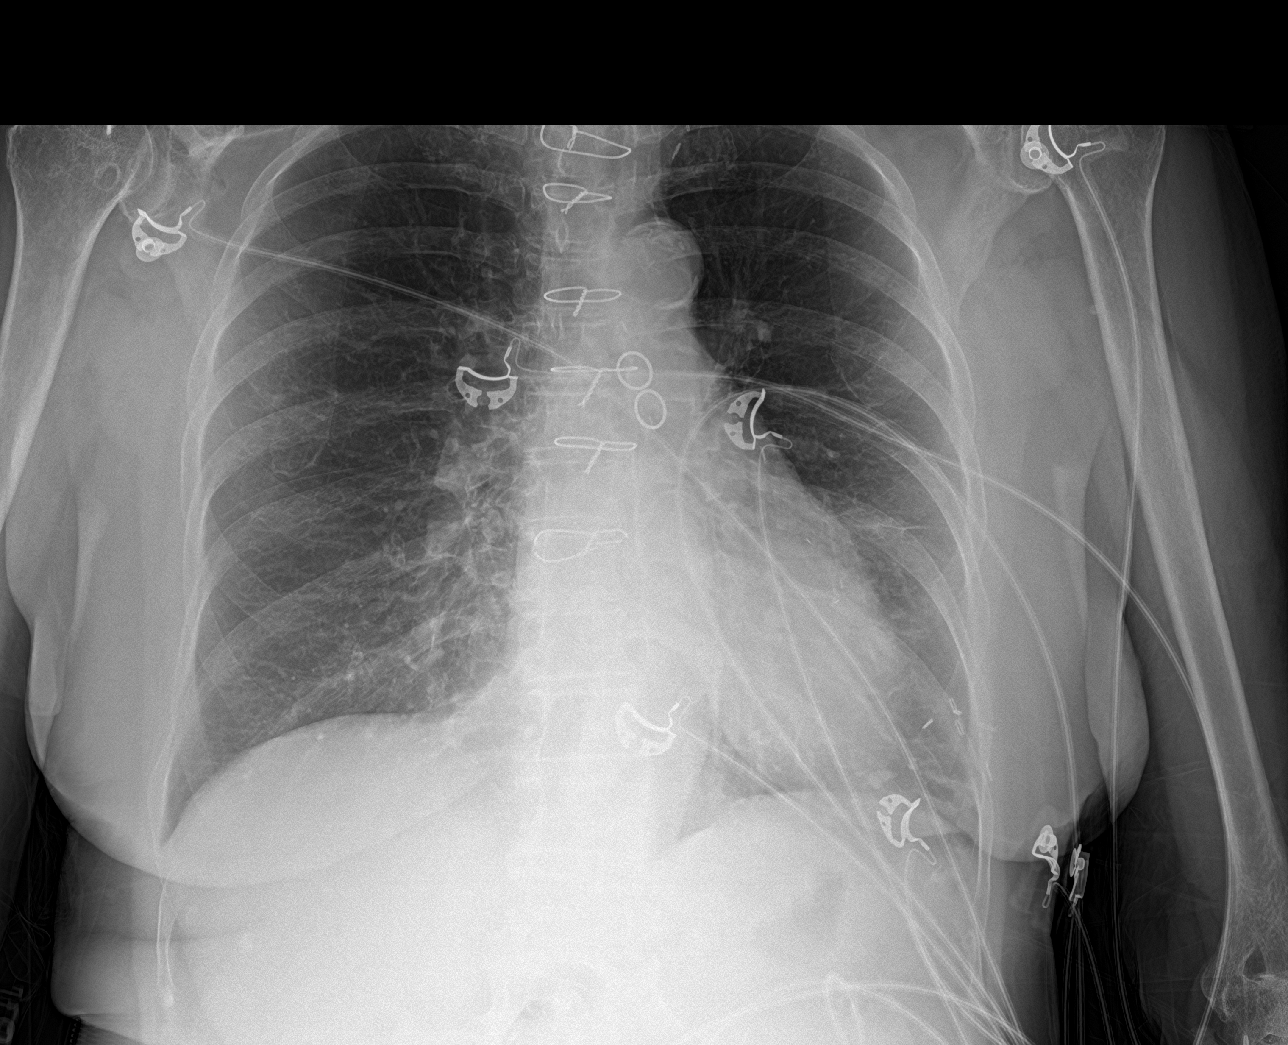
[im 3/3]
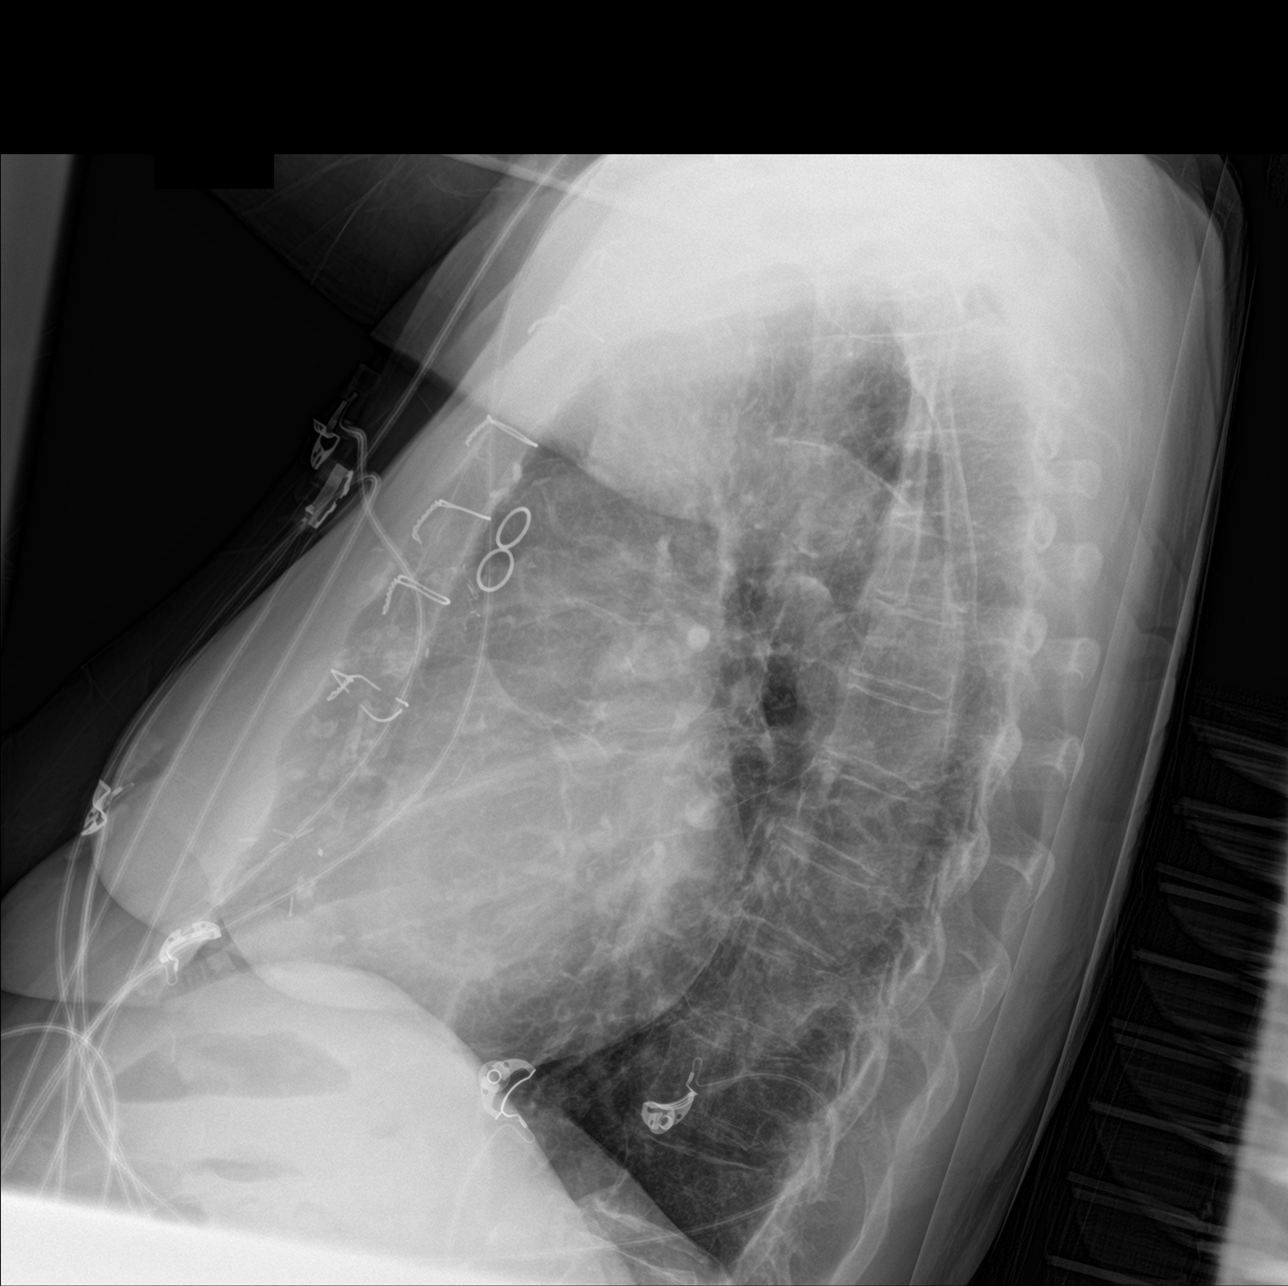

[3 of 3 positions shown; findings below may reference images not displayed]

FINDINGS: The cardiac silhouette, mediastinal and hilar contours are within
normal limits and stable. There is mild tortuosity and calcification
of the thoracic aorta.

Stable surgical changes from bypass surgery.

No acute pulmonary findings. No pleural effusion or worrisome
pulmonary lesions. The bony thorax is intact.
IMPRESSION: No acute cardiopulmonary findings.

## 2020-03-17 IMAGING — MR MR HEAD W/O CM
9 of 12 series · 29 of 48 positions shown · non-contrast
Comparison: Prior CT and CTA from earlier same day.

CLINICAL DATA: Initial evaluation for acute seizure like activity,
confusion.

EXAM:
MRI HEAD WITHOUT CONTRAST
TECHNIQUE: Multiplanar, multiecho pulse sequences of the brain and surrounding
structures were obtained without intravenous contrast.

[Series 5: ax dwi_tracew · axial · 3.0mm · 0.60mm/px · z∈[-71,+81]mm · 4 of 48 slices shown]
[im 1/48]
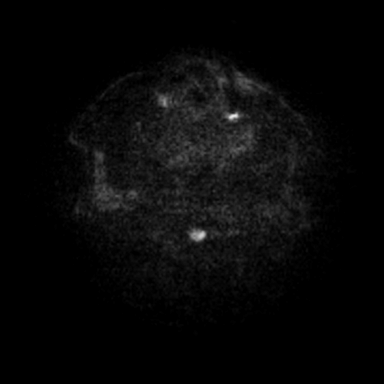
[im 16/48]
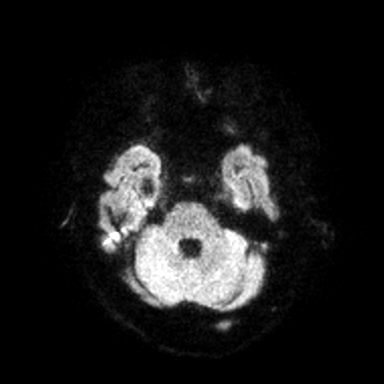
[im 32/48]
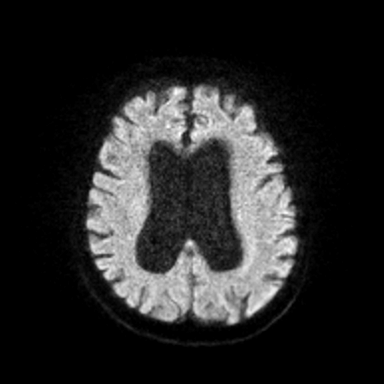
[im 48/48]
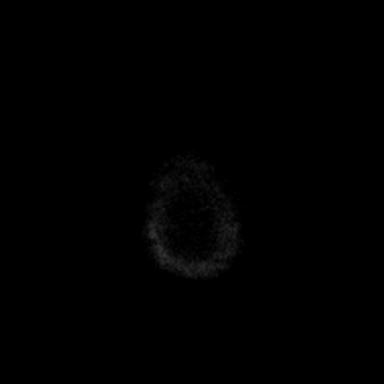

[Series 6: ax dwi_adc · axial · 3.0mm · 0.60mm/px · z∈[-71,+81]mm · 4 of 48 slices shown]
[im 1/48]
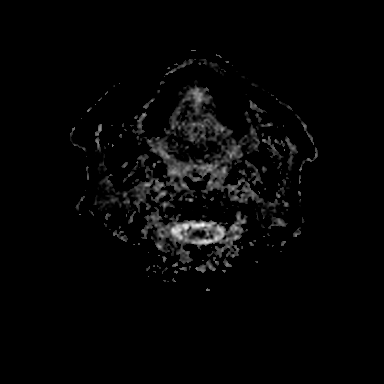
[im 16/48]
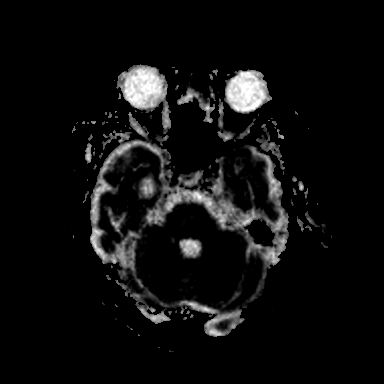
[im 32/48]
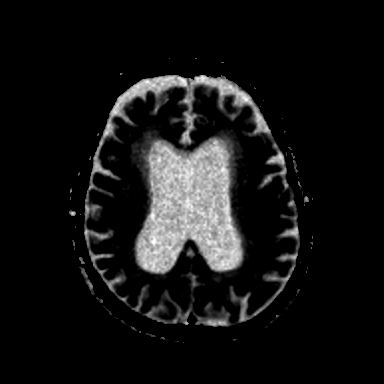
[im 48/48]
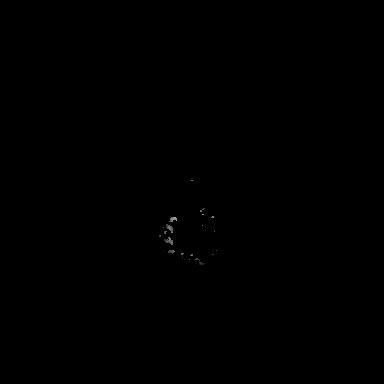

[Series 7: cor dwi_tracew · coronal · 5.0mm · 0.60mm/px · 1 of 36 slices shown]
[im 1/36]
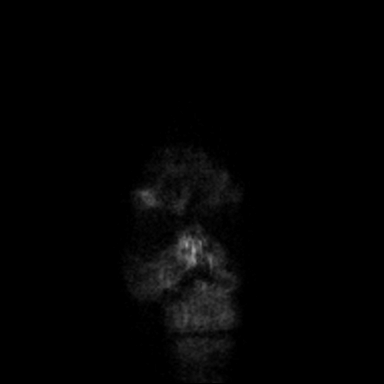

[Series 9: T1 · sagittal · 5.0mm · 0.62mm/px · 2 of 21 slices shown (1 of 2)]
[im 1/21]
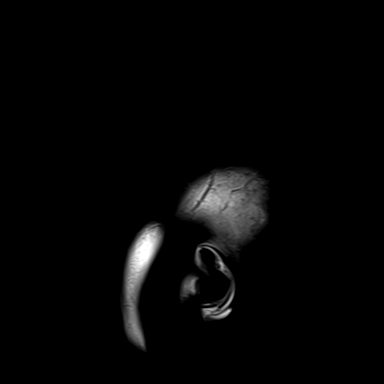
[im 21/21]
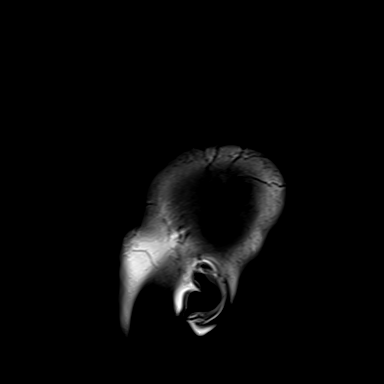

[Series 10: T2 · axial · 5.0mm · 0.53mm/px · z∈[-60,+81]mm · 2 of 25 slices shown (1 of 2)]
[im 1/25]
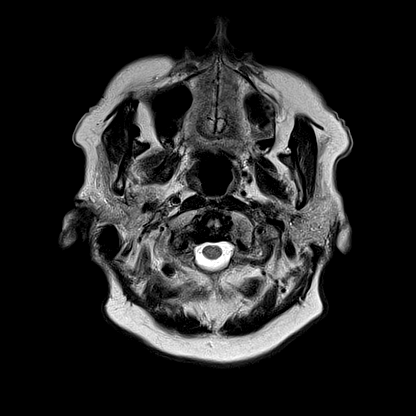
[im 25/25]
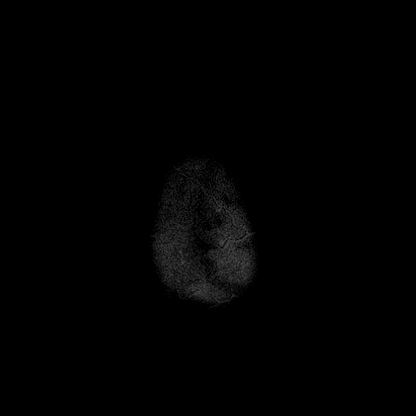

[Series 15: FLAIR · axial · 3.0mm · 0.53mm/px · z∈[-69,+90]mm · 4 of 55 slices shown (1 of 2)]
[im 1/55]
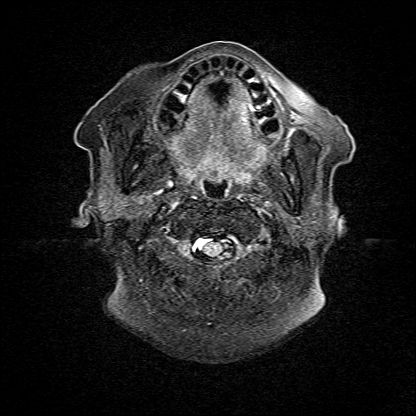
[im 19/55]
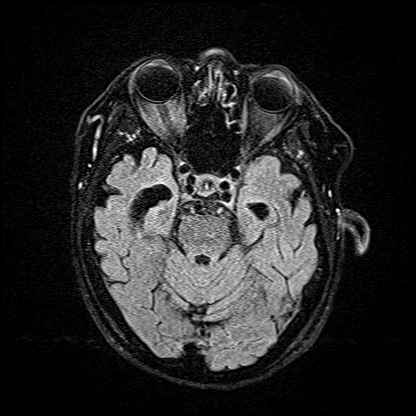
[im 37/55]
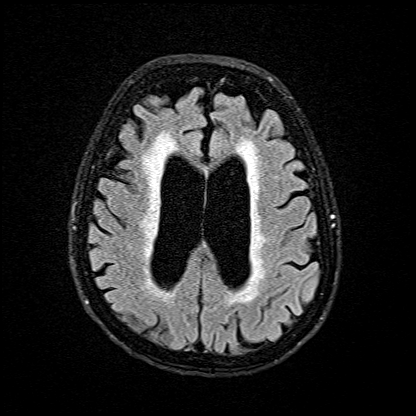
[im 55/55]
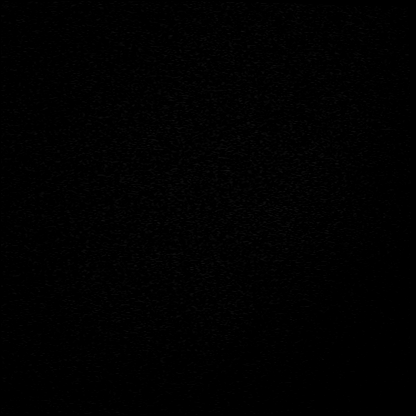

[Series 16: T1 · axial · 1.0mm · 0.98mm/px · z∈[-58,+82]mm · 8 of 144 slices shown (2 of 2)]
[im 1/144]
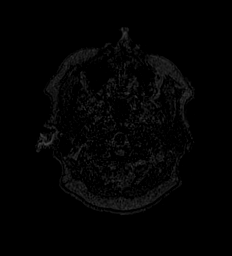
[im 27/144]
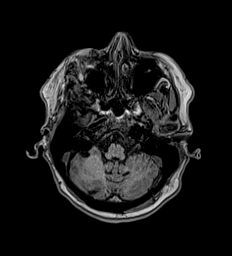
[im 40/144]
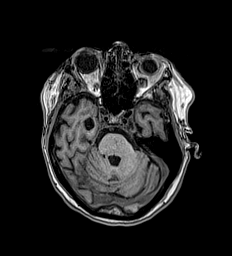
[im 66/144]
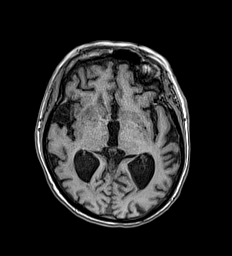
[im 79/144]
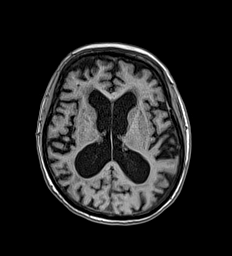
[im 105/144]
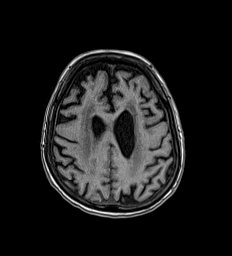
[im 118/144]
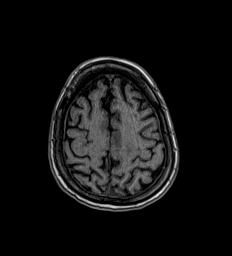
[im 144/144]
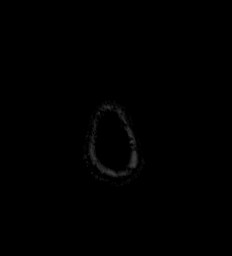

[Series 17: T2 · coronal · 3.0mm · 0.23mm/px · 2 of 30 slices shown (2 of 2)]
[im 1/30]
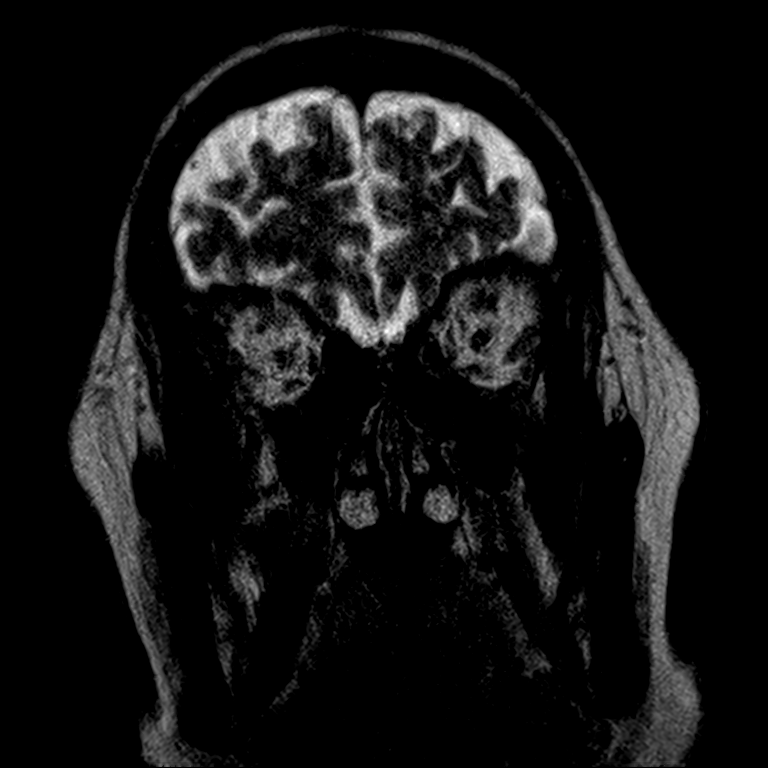
[im 30/30]
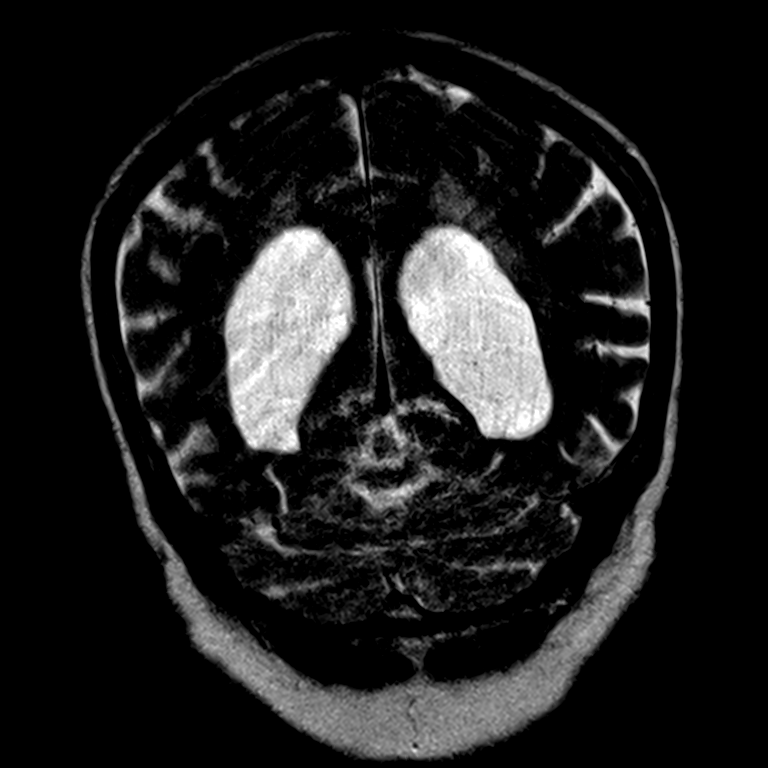

[Series 18: FLAIR · coronal · 3.0mm · 0.35mm/px · 2 of 30 slices shown (2 of 2)]
[im 1/30]
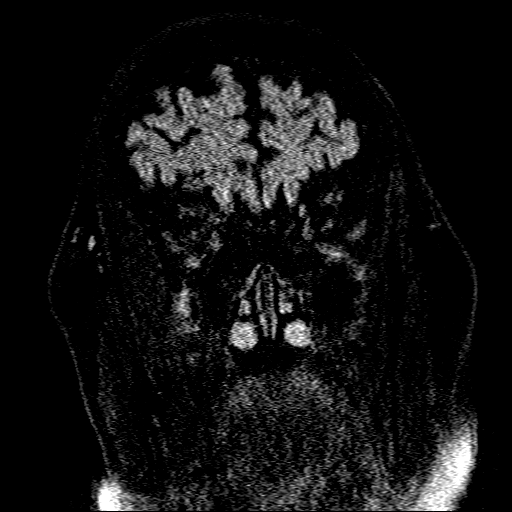
[im 30/30]
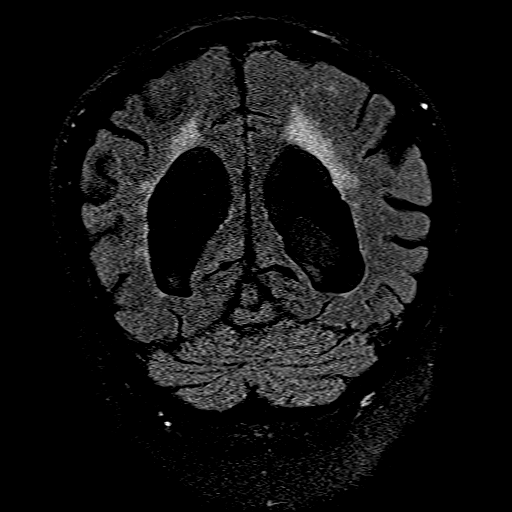

[29 of 48 positions shown; findings below may reference images not displayed]

FINDINGS: Brain: Generalized age-related cerebral atrophy. Patchy and
confluent T2/FLAIR hyperintensity within the periventricular deep
white matter both cerebral hemispheres most consistent with chronic
small vessel ischemic disease, moderate in nature.

No abnormal foci of restricted diffusion to suggest acute or
subacute ischemia. Tiny focus of apparent diffusion signal
abnormality involving the central aspect of the splenium on axial
DWI image 29 favored to be artifactual nature, with no signal
abnormality seen on corresponding FLAIR sequence. Gray-white matter
differentiation maintained without evidence for chronic cortical
infarction. No acute intracranial hemorrhage. Single chronic
microhemorrhage noted at the subcortical right frontal lobe, of
doubtful significance in isolation.

No mass lesion, midline shift or mass effect. No intrinsic temporal
lobe abnormality. Diffuse ventricular prominence related global
parenchymal volume loss of hydrocephalus. No extra-axial fluid
collection. Pituitary gland suprasellar region within normal limits.
Midline structures intact.

Vascular: Major intracranial vascular flow voids are maintained.

Skull and upper cervical spine: Craniocervical junction within
normal limits. Bone marrow signal intensity normal. No scalp soft
tissue abnormality.

Sinuses/Orbits: Patient status post bilateral ocular lens
replacement. Globes orbital soft tissues demonstrate no acute
finding. Mild scattered mucosal thickening noted within the
ethmoidal air cells. Paranasal sinuses are otherwise clear. No
mastoid effusion. Inner ear structures normal.

Other: None.
IMPRESSION: 1. No acute intracranial abnormality.
2. Generalized age-related cerebral atrophy with moderate chronic
small vessel ischemic disease.

## 2020-03-17 IMAGING — CT CT HEAD W/O CM
3 series · 16 of 45 positions shown, 19 images · non-contrast
Comparison: Head CT dated 02/28/2019

CLINICAL DATA: [AGE] female with altered mental status.

EXAM:
CT HEAD WITHOUT CONTRAST
TECHNIQUE: Contiguous axial images were obtained from the base of the skull
through the vertex without intravenous contrast.

[Series 3: head wo · axial · 0.40mm/px · z∈[-149,-34]mm · 10 of 28 slices shown, 13 images]
[im 3/28  brain]
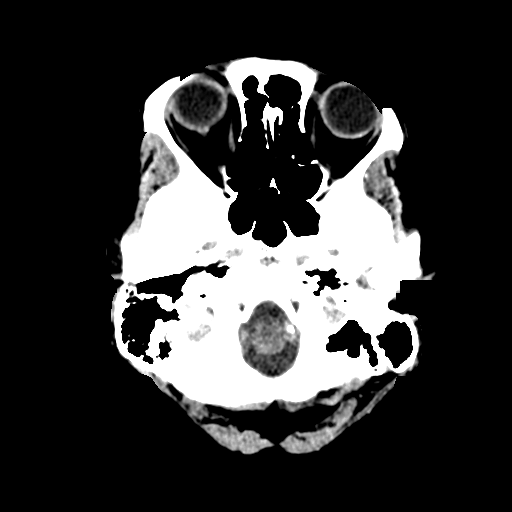
[im 3/28  bone]
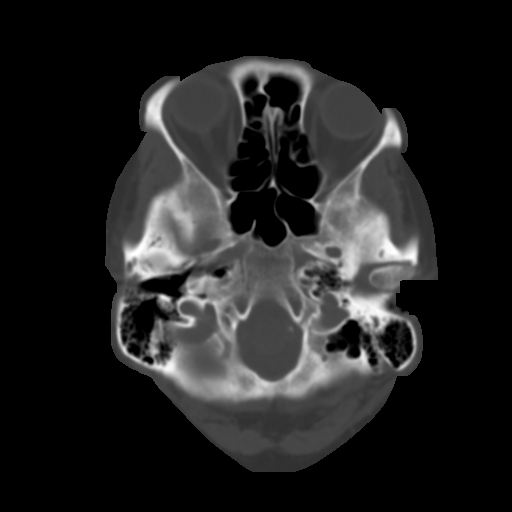
[im 5/28  brain]
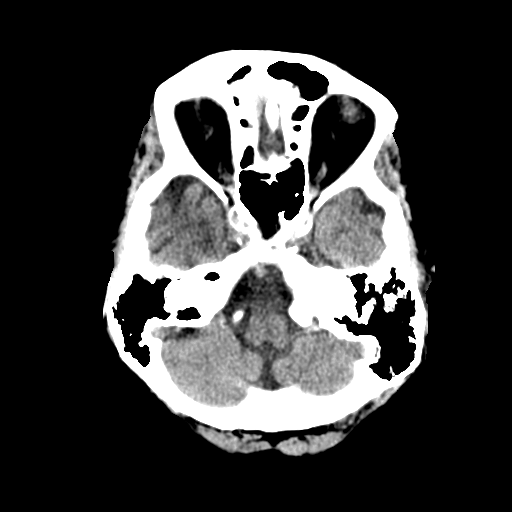
[im 8/28  brain]
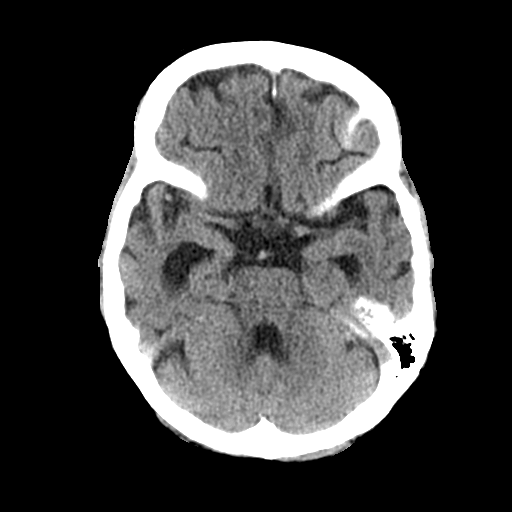
[im 11/28  brain]
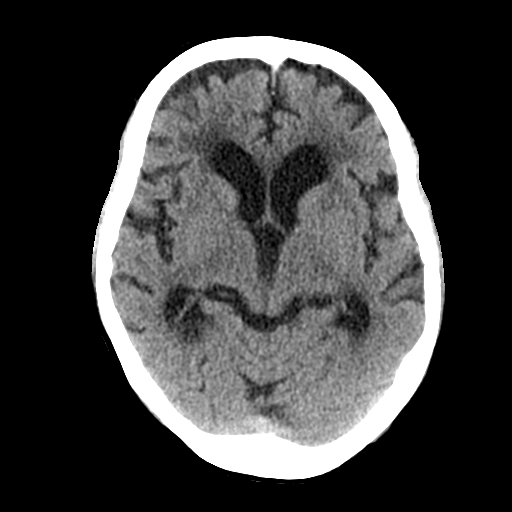
[im 13/28  brain]
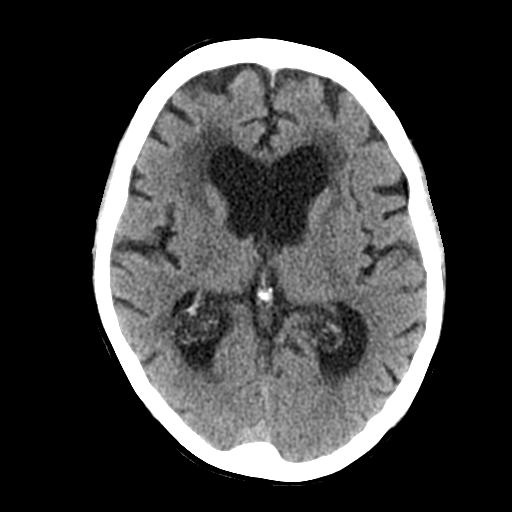
[im 13/28  bone]
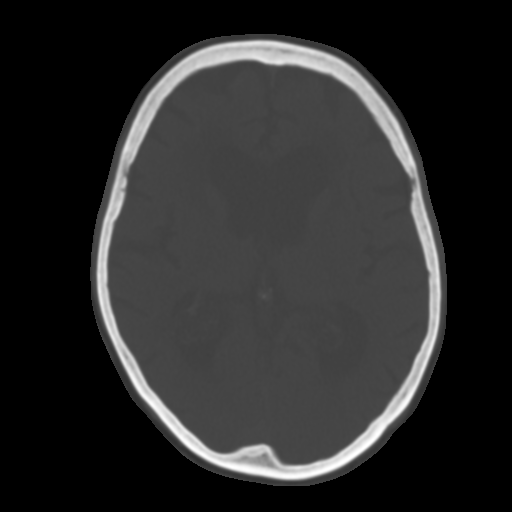
[im 16/28  brain]
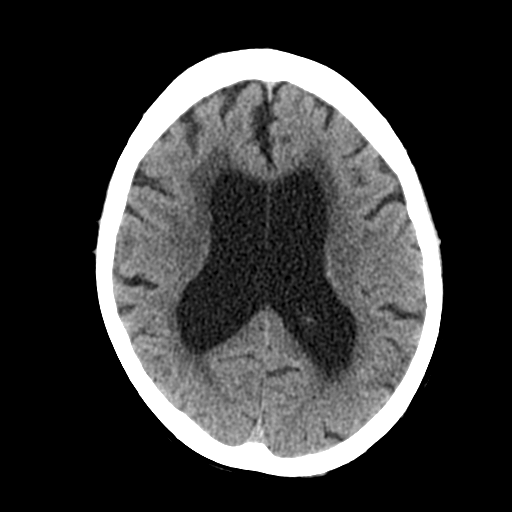
[im 18/28  brain]
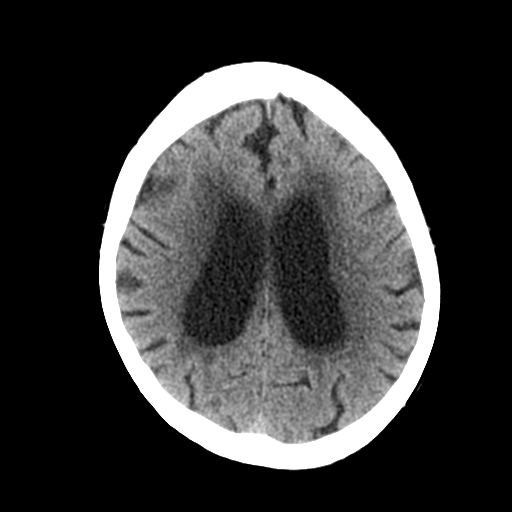
[im 21/28  brain]
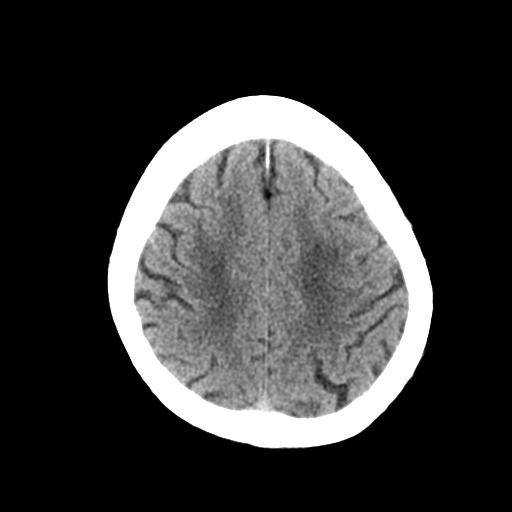
[im 24/28  brain]
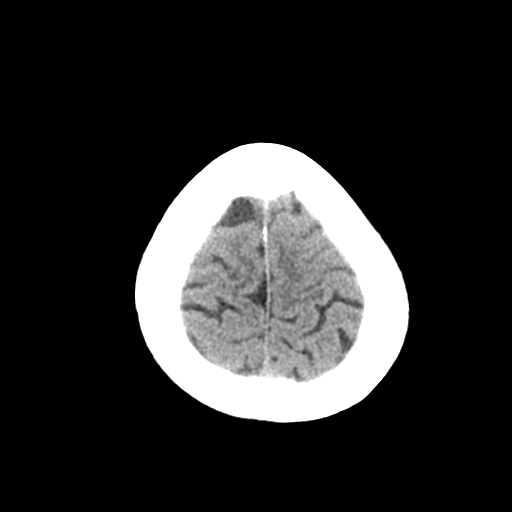
[im 24/28  bone]
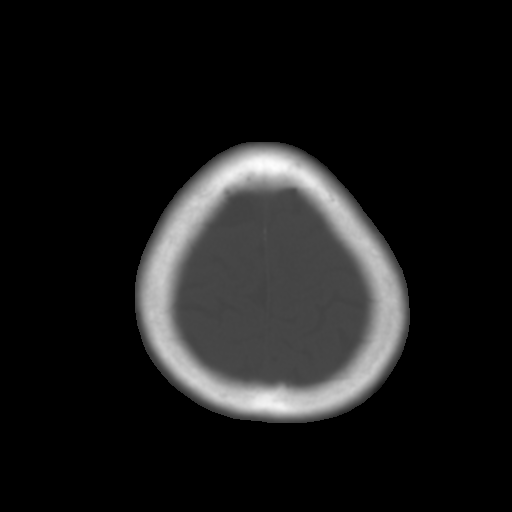
[im 26/28  brain]
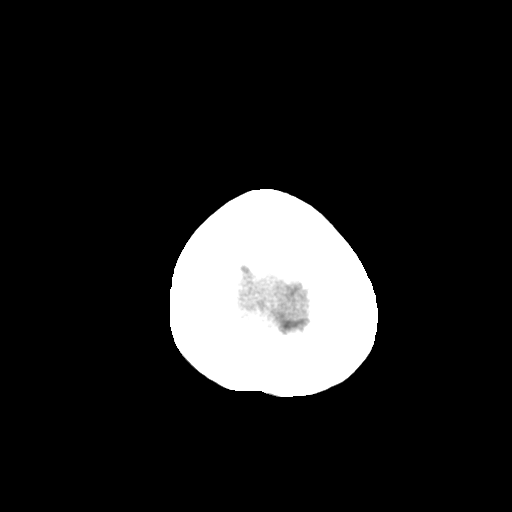

[Series 4: coronal soft tissue · coronal · 0.31mm/px · 3 of 62 slices shown]
[im 21/62  brain]
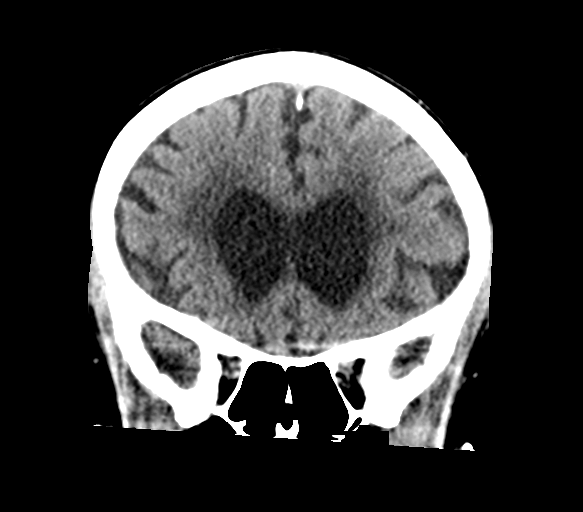
[im 28/62  brain]
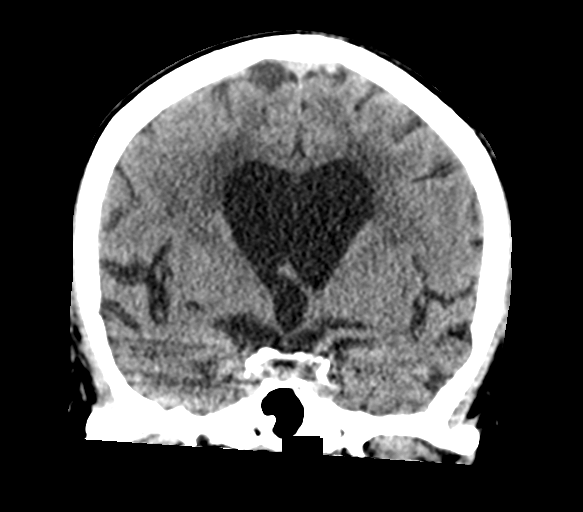
[im 34/62  brain]
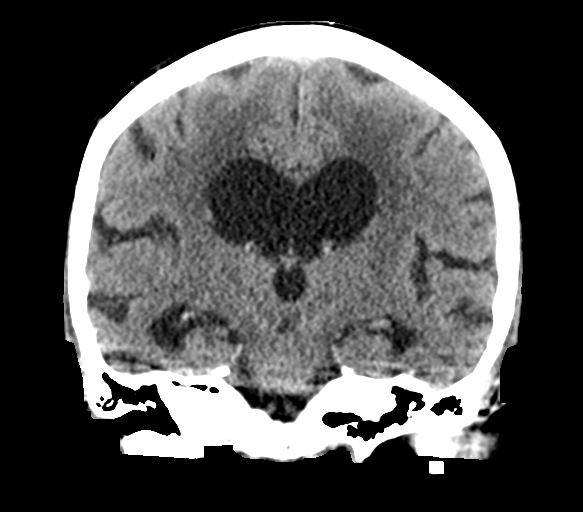

[Series 5: sagittal soft tissue · sagittal · 0.33mm/px · 3 of 52 slices shown]
[im 18/52  brain]
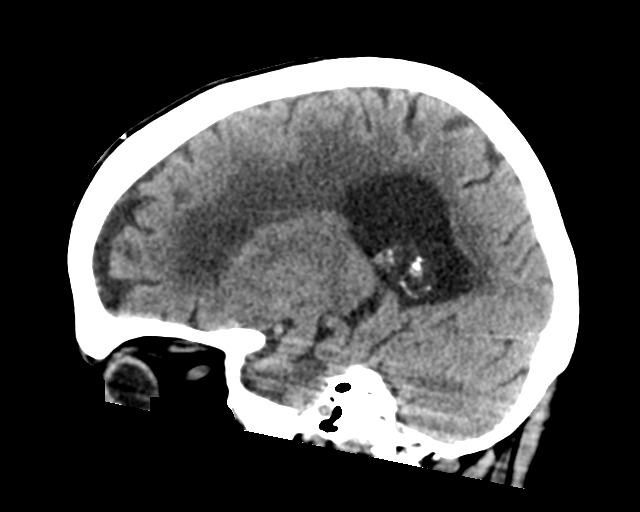
[im 26/52  brain]
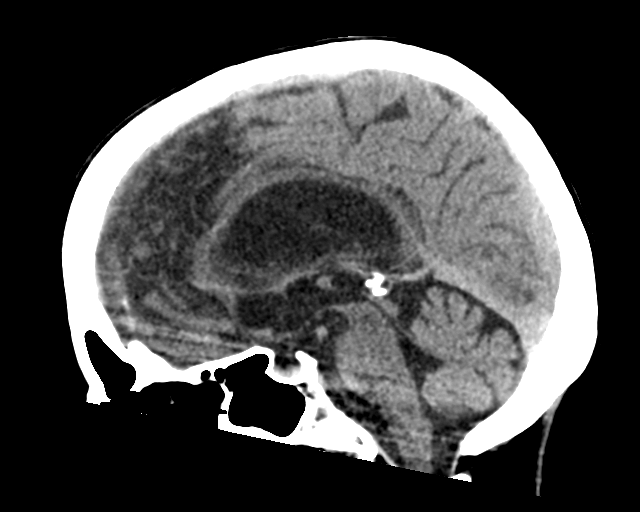
[im 35/52  brain]
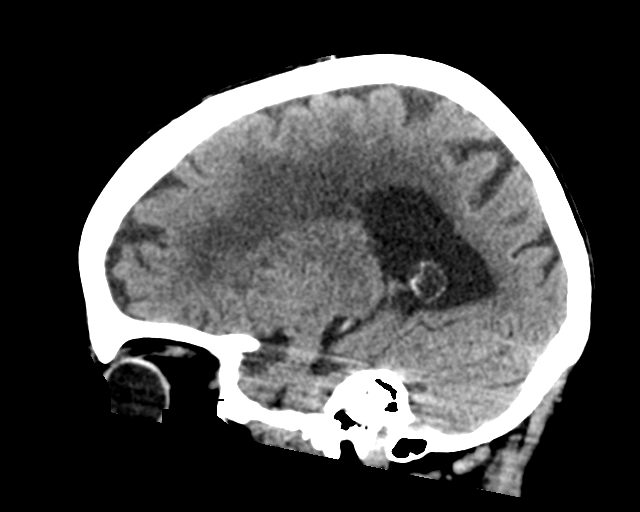

[16 of 45 positions shown; findings below may reference images not displayed]

FINDINGS: Brain: There is moderate age-related atrophy and chronic
microvascular ischemic changes. Stable dilatation of the lateral and
third ventricles similar to prior CT. There is no acute intracranial
hemorrhage. No mass effect or midline shift. No extra-axial fluid
collection.

Vascular: No hyperdense vessel or unexpected calcification.

Skull: Normal. Negative for fracture or focal lesion.

Sinuses/Orbits: No acute finding.

Other: None
IMPRESSION: 1. No acute intracranial hemorrhage.
2. Stable atrophy and ventricular dilatation similar to prior CT.
